# Patient Record
Sex: Female | Born: 1950 | Hispanic: Yes | Marital: Married | Smoking: Never smoker
Health system: Southern US, Community
[De-identification: ages and names within clinical notes are randomized; demographics above are authoritative.]

## PROBLEM LIST (undated history)

## (undated) DIAGNOSIS — E119 Type 2 diabetes mellitus without complications: Secondary | ICD-10-CM

## (undated) DIAGNOSIS — M1990 Unspecified osteoarthritis, unspecified site: Secondary | ICD-10-CM

## (undated) HISTORY — DX: Type 2 diabetes mellitus without complications: E11.9

---

## 2001-06-11 HISTORY — PX: COLONOSCOPY: SHX174

## 2003-06-12 HISTORY — PX: BREAST BIOPSY: SHX20

## 2010-06-11 DIAGNOSIS — E119 Type 2 diabetes mellitus without complications: Secondary | ICD-10-CM

## 2010-06-11 HISTORY — DX: Type 2 diabetes mellitus without complications: E11.9

## 2012-06-09 ENCOUNTER — Encounter: Payer: Self-pay | Admitting: Family Medicine

## 2012-06-09 ENCOUNTER — Ambulatory Visit (INDEPENDENT_AMBULATORY_CARE_PROVIDER_SITE_OTHER): Payer: Self-pay | Admitting: Family Medicine

## 2012-06-09 VITALS — BP 137/63 | HR 82 | Temp 98.8°F | Ht 59.5 in | Wt 201.0 lb

## 2012-06-09 DIAGNOSIS — M25562 Pain in left knee: Secondary | ICD-10-CM

## 2012-06-09 DIAGNOSIS — M25569 Pain in unspecified knee: Secondary | ICD-10-CM

## 2012-06-09 DIAGNOSIS — M25561 Pain in right knee: Secondary | ICD-10-CM

## 2012-06-09 DIAGNOSIS — Z0000 Encounter for general adult medical examination without abnormal findings: Secondary | ICD-10-CM

## 2012-06-09 DIAGNOSIS — R1032 Left lower quadrant pain: Secondary | ICD-10-CM

## 2012-06-09 MED ORDER — MELOXICAM 7.5 MG PO TABS: 7.5000 mg | ORAL_TABLET | Freq: Every day | ORAL | Status: DC

## 2012-06-09 NOTE — Progress Notes (Signed)
  Subjective:    Patient ID: Bridget Russo, female    DOB: [birth year], 61 y.o.   MRN: [id number]  HPI 61 yo F who recently moved here several months after moving from [place].  Was previously seen at [facility] and has now transferred her care to [place].    Little past medical history.  Main complaints for today are BL knee pain and Left-sided abdominal pain:  1.  Bilateral knee pain:  Present for years.  No inciting event or traumatic incidents.  Describes sharp stabbing pain with some burning bilaterally.  Has pain that is constant 3-4/10 in severity, at end of day worsens to 7/10.  Has been told she had arthritis in past.  No knee weakness, giving out, locking on her.   2.  Left sided abdominal pain:  Pain started 1 year but worse in past 2 months, described as constant.  Worse when she sleeps on Left side.  Has 2-3 bowel movements a day, describes attempting to evacuate bowels without any results occasionally.  Describes soft, light brown stools when she is able to go.  Occasionally has feeling of incomplete evacuation.    Preventative: - Pap smear October 2013 - never abnormal - Normal mammogram July 2012 - had breast biopsy in 2005 which was negative, otherwise no problems with mammogram - Colonoscopy:  2012 - told everything was okay - Flu shot:  Never had one in past, does not want one today.   Review of Systems The patient denies fever, unusual weight change, decreased hearing, chest pain, palpitations, pre-syncopal or syncopal episodes, dyspnea on exertion, prolonged cough, hemoptysis, change in bowel habits, melena, hematochezia, severe indigestion/heartburn, nausea/vomiting/abdominal pain, genital sores, muscle weakness, difficulty walking, abnormal bleeding, or enlarged lymph nodes.       Objective:   Physical Exam  BP 137/63  Pulse 82  Temp 98.8 F (37.1 C) (Oral)  Ht 4' 11.5" (1.511 m)  Wt 201 lb (91.173 kg)  BMI 39.92 kg/m2 Gen: Well NAD.  Comfortable appearing.    HEENT:  [place]/AT.  EOMI, PERRL.  MMM, tonsils non-erythematous, non-edematous.  External ears WNL, Bilateral TM's normal without retraction, redness or bulging.  Neck:  No thyromegaly Lungs: CTABL Nl WOB Heart: RRR no MRG Abd: Soft/ND.  Obese.  TTP LLQ only to deep palpation, mild in nature, no guarding or rebound.   Exts: Non edematous BL  LE, warm and well perfused.  MSK:    - Knees:  Bilaterally pain along joint line only to deep palpation.  No effusions noted.  No redness or warmth.  Full active/passive range of motion with crepitus noted BL.  Ant/post drawer tests negative with no ligamentous laxity BL.        Assessment & Plan:

## 2012-06-09 NOTE — Assessment & Plan Note (Addendum)
Interested in corticosteroid injection.  Mobic for now.  Tylenol as needed. FU after she receives [employer].   Check creatinine at that time before any refills for Meloxicam.

## 2012-06-09 NOTE — Patient Instructions (Signed)
Make sure to get the [facility].  Take the Meloxicam daily to help with the pain.  If it is very bad, take the extra-strength Tylenol if you need it.  Come back in 4 weeks once you have the card and we can do blood work and cortisone shot at that time.    It was good to meet you!

## 2012-06-09 NOTE — Assessment & Plan Note (Signed)
Patient up to date on everything except flu shot, which she declines today.

## 2012-06-09 NOTE — Assessment & Plan Note (Signed)
Possibly secondary to constipation.   Treat with stool softener and prune juice -- added these instructions in pen to her AVS after it was printed.   I provided the patient with explicit warnings and red flags that would prompt return to clinic or the ED.

## 2012-07-25 ENCOUNTER — Ambulatory Visit: Payer: Self-pay | Admitting: Family Medicine

## 2013-12-02 ENCOUNTER — Ambulatory Visit: Payer: No Typology Code available for payment source | Attending: Internal Medicine

## 2013-12-24 ENCOUNTER — Other Ambulatory Visit: Payer: Self-pay | Admitting: Obstetrics and Gynecology

## 2013-12-24 DIAGNOSIS — Z1231 Encounter for screening mammogram for malignant neoplasm of breast: Secondary | ICD-10-CM

## 2014-01-05 ENCOUNTER — Encounter (HOSPITAL_COMMUNITY): Payer: Self-pay

## 2014-01-05 ENCOUNTER — Ambulatory Visit (HOSPITAL_COMMUNITY)
Admission: RE | Admit: 2014-01-05 | Discharge: 2014-01-05 | Disposition: A | Discharge status: Home / Self Care | Payer: Self-pay | Source: Ambulatory Visit | Attending: Obstetrics and Gynecology | Admitting: Obstetrics and Gynecology

## 2014-01-05 VITALS — BP 120/78 | Temp 98.4°F | Ht 62.0 in | Wt 202.4 lb

## 2014-01-05 DIAGNOSIS — Z1231 Encounter for screening mammogram for malignant neoplasm of breast: Secondary | ICD-10-CM

## 2014-01-05 DIAGNOSIS — Z1239 Encounter for other screening for malignant neoplasm of breast: Secondary | ICD-10-CM

## 2014-01-05 HISTORY — DX: Unspecified osteoarthritis, unspecified site: M19.90

## 2014-01-05 NOTE — Progress Notes (Signed)
No complaints today.  Pap Smear:    Pap smear not completed today. Last Pap smear was in October 2013 at the free cervical cancer screening [facility] and normal per patient. Per patient has no history of an abnormal Pap smear. No Pap smear results in EPIC.  Physical exam: Breasts Breasts symmetrical. No skin abnormalities bilateral breasts. No nipple retraction bilateral breasts. No nipple discharge bilateral breasts. No lymphadenopathy. No lumps palpated bilateral breasts. No complaints of pain or tenderness on exam. Referred [facility] to [facility] for diagnostic mammogram per recommendation. Patients last mammogram was 12/18/2012 and needed 6 month follow-up. Patient has not followed-up. Appointment scheduled for Wednesday, January 06, 2014 at 1015.    Pelvic/Bimanual No Pap smear completed today since last Pap smear was October 2013 per patient. Pap smear not indicated per [facility]CP guidelines.

## 2014-01-05 NOTE — Patient Instructions (Addendum)
Explained to  Bridget Russo that she did not need a Pap smear today due to last Pap smear was in October 2013 per patient. Let her know [facility] will cover Pap smears every 3 years unless has a history of abnormal Pap smears. Referred [facility] to [facility] for diagnostic mammogram per recommendation. Appointment scheduled for Wednesday, January 06, 2014 at 1015. [facility] for Friday, January 08, 2014 at 0830. Encouraged patient to follow-up at either an Urgent Care or Emergency Department in regards to her dizziness and vertigo symptoms. Patient aware of appointments and will be there. Bridget Russo verbalized understanding.  Bridget Russo, Kathaleen Maserhristine Poll, RN 1:08 PM

## 2014-01-06 LAB — HM MAMMOGRAPHY

## 2014-01-08 ENCOUNTER — Ambulatory Visit (HOSPITAL_BASED_OUTPATIENT_CLINIC_OR_DEPARTMENT_OTHER): Payer: No Typology Code available for payment source

## 2014-01-08 ENCOUNTER — Encounter (HOSPITAL_COMMUNITY): Payer: Self-pay

## 2014-01-08 ENCOUNTER — Other Ambulatory Visit: Payer: No Typology Code available for payment source

## 2014-01-08 VITALS — BP 150/90 | HR 54 | Temp 98.0°F | Resp 22 | Ht 59.0 in | Wt 199.6 lb

## 2014-01-08 DIAGNOSIS — Z0000 Encounter for general adult medical examination without abnormal findings: Secondary | ICD-10-CM

## 2014-01-08 LAB — LIPID PANEL
CHOL/HDL RATIO: 2.5 ratio
Cholesterol: 241 mg/dL — ABNORMAL HIGH (ref 0–200)
HDL: 98 mg/dL (ref >39)
LDL CALC: 110 mg/dL — AB (ref 0–99)
TRIGLYCERIDES: 163 mg/dL — AB (ref <150)
VLDL: 33 mg/dL (ref 0–40)

## 2014-01-08 LAB — HEMOGLOBIN A1C
Hgb A1c MFr Bld: 6.7 % — ABNORMAL HIGH (ref <5.7)
MEAN PLASMA GLUCOSE: 146 mg/dL — AB (ref <117)

## 2014-01-08 LAB — GLUCOSE (CC13): Glucose: 110 mg/dl (ref 70–140)

## 2014-01-08 NOTE — Patient Instructions (Signed)
Discussed health assessment with patient. Informed patient that she would need to be followed up for blood pressure. She will be called with results of lab work and we will then discussed any further follow up the patient needs. Patient will implement behavior modifications to help lower BP. Patient verbalized understanding. 

## 2014-01-08 NOTE — Progress Notes (Signed)
[facility] and is currently a [facility] patient effective 01/04/2014 and with interpreter.   Clinical Measurements: Patient is 4 ft. 11 inches, weight 199.6 lbs, waist circumference 42.5 inches, and hip circumference 49 inches.   Medical History: Patient has no history of high cholesterol. Patient does not have a history of hypertension or diabetes. Patient states has Family history of diabetes. Per patient no diagnosed history of coronary heart disease, heart attack, heart failure, stroke/TIA, vascular disease or congenital heart defects.   Blood Pressure, Self-measurement: Patient states has no reason to check Blood pressure.  Nutrition Assessment: Patient stated that eats 3 fruits every day. Patient states she eats one to 2 servings of vegetables a day. Per patient eats 3 or more ounces of whole grains daily. Patient doesn't eat two or more servings of fish weekly. Patient states eats once a week. Patient states she does not drink more than 36 ounces or 450 calories of beverages with added sugars weekly. Patient stated does not watch salt intake.  Physical Activity Assessment: Patient states that does around 1140 minutes per week of moderate exercise a week by walking and house hold chores. Per patient does not do vigorous exercise.  Smoking Status: Patient states has never smoked. Patient is not exposed to smoke.  Quality of Life Assessment: In assessing patient's physical quality of life she stated that out of the past 30 days that she has felt her health was not good for 12 of them. Patient also stated that in the past 30 days that her mental health is not good including stress, depression and problems with emotions for 2 days. Patient did state that out of the past 30 days she felt her physical or mental health had not kept her from doing her usual activities including self-care, work or recreation.   Plan: Lab work will be done today including a lipid  panel, blood glucose, and Hgb A1C. Will call lab results when they are finished. Patient will return for Health Coaching and BP check. Will work on behavior modifications that we discussed to lower BP. Will get doctors appointment for Blood pressure.

## 2014-01-12 ENCOUNTER — Telehealth: Payer: Self-pay

## 2014-01-12 NOTE — Telephone Encounter (Signed)
Bridget RoyalsJulie Russo, interpreter tried to call patient on 01/11/14 and could not reach or leave message. The interpreter then texted the patient. I called patient and she stated could not read text and for [name] to call back because of patients limited English ability. Told patient that would get [name] to call tomorrow on 01/13/14.  PLAN: Call [name] on 01/13/14 to call patient with lab results and Doctor's appointment.

## 2014-01-18 ENCOUNTER — Ambulatory Visit: Payer: No Typology Code available for payment source | Attending: Internal Medicine | Admitting: Internal Medicine

## 2014-01-18 ENCOUNTER — Encounter: Payer: Self-pay | Admitting: Internal Medicine

## 2014-01-18 VITALS — BP 134/85 | HR 66 | Temp 97.9°F | Resp 14 | Ht 60.0 in | Wt 200.0 lb

## 2014-01-18 DIAGNOSIS — E785 Hyperlipidemia, unspecified: Secondary | ICD-10-CM | POA: Insufficient documentation

## 2014-01-18 DIAGNOSIS — E119 Type 2 diabetes mellitus without complications: Secondary | ICD-10-CM

## 2014-01-18 HISTORY — DX: Type 2 diabetes mellitus without complications: E11.9

## 2014-01-18 LAB — CBC WITH DIFFERENTIAL/PLATELET
Basophils Absolute: 0 10*3/uL (ref 0.0–0.1)
Basophils Relative: 0 % (ref 0–1)
EOS ABS: 0.1 10*3/uL (ref 0.0–0.7)
Eosinophils Relative: 1 % (ref 0–5)
HEMATOCRIT: 41.1 % (ref 36.0–46.0)
Hemoglobin: 13.9 g/dL (ref 12.0–15.0)
Lymphocytes Relative: 23 % (ref 12–46)
Lymphs Abs: 3 10*3/uL (ref 0.7–4.0)
MCH: 28.8 pg (ref 26.0–34.0)
MCHC: 33.8 g/dL (ref 30.0–36.0)
MCV: 85.3 fL (ref 78.0–100.0)
MONO ABS: 1.1 10*3/uL — AB (ref 0.1–1.0)
Monocytes Relative: 8 % (ref 3–12)
Neutro Abs: 9 10*3/uL — ABNORMAL HIGH (ref 1.7–7.7)
Neutrophils Relative %: 68 % (ref 43–77)
PLATELETS: 264 10*3/uL (ref 150–400)
RBC: 4.82 MIL/uL (ref 3.87–5.11)
RDW: 15.5 % (ref 11.5–15.5)
WBC: 13.2 10*3/uL — ABNORMAL HIGH (ref 4.0–10.5)

## 2014-01-18 LAB — POCT GLYCOSYLATED HEMOGLOBIN (HGB A1C): Hemoglobin A1C: 6.2

## 2014-01-18 MED ORDER — PRAVASTATIN SODIUM 20 MG PO TABS: 20.0000 mg | ORAL_TABLET | Freq: Every day | ORAL | Status: DC

## 2014-01-18 MED ORDER — METFORMIN HCL 500 MG PO TABS: 500.0000 mg | ORAL_TABLET | Freq: Two times a day (BID) | ORAL | Status: DC

## 2014-01-18 NOTE — Progress Notes (Signed)
Patient ID: Bridget Russo, female   DOB: April 07, 1951, 63 y.o.   MRN: [id number]   Bridget Russo, is a 63 y.o. female  NWG:[id number]  VHQ:[id number]  DOB - 03-Nov-1950  CC:  Chief Complaint  Patient presents with  . Establish Care       HPI: Bridget Russo is a 63 y.o. female here today to establish medical care. Patient recently went to [facility] program for a well visit including mammogram and Pap smear, there was need to do further testing because of her weight patient was found to have hemoglobin A1c of 6.7%, as well as high cholesterol level. She was subsequently sent to our clinic to establish care and for treatment of new onset diabetes. Patient has no personal history of diabetes or hypertension, she's not on any medication. She has had history of diabetes in her mother. Patient does not smoke cigarette, she does not drink alcohol. Patient has No headache, No chest pain, No abdominal pain - No Nausea, No new weakness tingling or numbness, No Cough - SOB.  No Known Allergies Past Medical History  Diagnosis Date  . Arthritis   . Type 2 diabetes mellitus without complication 01/18/2014   Current Outpatient Prescriptions on File Prior to Visit  Medication Sig Dispense Refill  . meloxicam (MOBIC) 7.5 MG tablet Take 1 tablet (7.5 mg total) by mouth daily.  30 tablet  0   No current facility-administered medications on file prior to visit.   Family History  Problem Relation Age of Onset  . Diabetes Mother   . Diabetes Sister   . Breast cancer Sister   . Diabetes Brother   . Breast cancer Maternal Grandmother    History   Social History  . Marital Status: Married    Spouse Name: N/A    Number of Children: N/A  . Years of Education: N/A   Occupational History  . Not on file.   Social History Main Topics  . Smoking status: Never Smoker   . Smokeless tobacco: Never Used  . Alcohol Use: No  . Drug Use: No  . Sexual Activity: No   Other Topics Concern  . Not on  file   Social History Narrative  . No narrative on file    Review of Systems: Constitutional: Negative for fever, chills, diaphoresis, activity change, appetite change and fatigue. HENT: Negative for ear pain, nosebleeds, congestion, facial swelling, rhinorrhea, neck pain, neck stiffness and ear discharge.  Eyes: Negative for pain, discharge, redness, itching and visual disturbance. Respiratory: Negative for cough, choking, chest tightness, shortness of breath, wheezing and stridor.  Cardiovascular: Negative for chest pain, palpitations and leg swelling. Gastrointestinal: Negative for abdominal distention. Genitourinary: Negative for dysuria, urgency, frequency, hematuria, flank pain, decreased urine volume, difficulty urinating and dyspareunia.  Musculoskeletal: Negative for back pain, joint swelling, arthralgia and gait problem. Neurological: Negative for dizziness, tremors, seizures, syncope, facial asymmetry, speech difficulty, weakness, light-headedness, numbness and headaches.  Hematological: Negative for adenopathy. Does not bruise/bleed easily. Psychiatric/Behavioral: Negative for hallucinations, behavioral problems, confusion, dysphoric mood, decreased concentration and agitation.    Objective:   Filed Vitals:   01/18/14 1539  BP: 134/85  Pulse: 66  Temp: 97.9 F (36.6 C)  Resp: 14    Physical Exam: Constitutional: Patient appears well-developed and well-nourished. No distress, Obese HENT: Normocephalic, atraumatic, External right and left ear normal. Oropharynx is clear and moist.  Eyes: Conjunctivae and EOM are normal. PERRLA, no scleral icterus. Neck: Normal ROM. Neck supple. No JVD. No tracheal  deviation. No thyromegaly. CVS: RRR, S1/S2 +, no murmurs, no gallops, no carotid bruit.  Pulmonary: Effort and breath sounds normal, no stridor, rhonchi, wheezes, rales.  Abdominal: Soft. BS +, no distension, tenderness, rebound or guarding.  Musculoskeletal: Normal range of  motion. No edema and no tenderness.  Lymphadenopathy: No lymphadenopathy noted, cervical, inguinal or axillary Neuro: Alert. Normal reflexes, muscle tone coordination. No cranial nerve deficit. Skin: Skin is warm and dry. No rash noted. Not diaphoretic. No erythema. No pallor. Psychiatric: Normal mood and affect. Behavior, judgment, thought content normal.  Lab Results  Component Value Date   HGBA1C 6.2 01/18/2014   HGBA1C 6.7* 01/08/2014    Lipid Panel     Component Value Date/Time   CHOL 241* 01/08/2014 0942   TRIG 163* 01/08/2014 0942   HDL 98 01/08/2014 0942   CHOLHDL 2.5 01/08/2014 0942   VLDL 33 01/08/2014 0942   LDLCALC 110* 01/08/2014 0942       Assessment and plan:   1. Type 2 diabetes mellitus without complication  - metFORMIN (GLUCOPHAGE) 500 MG tablet; Take 1 tablet (500 mg total) by mouth 2 (two) times daily with a meal.  Dispense: 180 tablet; Refill: 3  - Amb Referral to Nutrition and Diabetic Education - CBC with Differential - COMPLETE METABOLIC PANEL WITH GFR - POCT glycosylated hemoglobin (Hb A1C) - TSH - Urinalysis, Complete  2. Dyslipidemia  - pravastatin (PRAVACHOL) 20 MG tablet; Take 1 tablet (20 mg total) by mouth daily.  Dispense: 90 tablet; Refill: 3 - Lipid panel  Patient was extensively counseled about nutrition and exercise Interpreter was used to communicate directly with patient for the entire encounter including providing detailed patient instructions.  Return in about 3 months (around 04/20/2014), or if symptoms worsen or fail to improve, for Hemoglobin A1C and Follow up, DM, Follow up HTN.  The patient was given clear instructions to go to ER or return to medical center if symptoms don't improve, worsen or new problems develop. The patient verbalized understanding. The patient was told to call to get lab results if they haven't heard anything in the next week.     This note has been created with [employer] speech recognition software and smart  phrase technology. Any transcriptional errors are unintentional.    Jeanann LewandowskyJEGEDE, Candia Kingsbury, MD, MHA, FACP, FAAP [facility] [place], [place] [phone]   01/18/2014, 4:41 PM

## 2014-01-18 NOTE — Patient Instructions (Addendum)
Plan de alimentacin DASH (DASH Eating Plan) DASH es la sigla en ingls de "Enfoques Alimentarios para Detener la Hipertensin". El plan de alimentacin DASH ha demostrado bajar la presin arterial elevada (hipertensin). Los beneficios adicionales para la salud pueden incluir la disminucin del riesgo de diabetes mellitus tipo2, enfermedades cardacas e ictus. Este plan tambin puede ayudar a Horticulturist, commercial. QU DEBO SABER ACERCA DEL PLAN DE ALIMENTACIN DASH? Para el plan de alimentacin DASH, seguir las siguientes pautas generales:  Elija los alimentos con un valor porcentual diario de sodio de menos del 5% (segn figura en la etiqueta del alimento).  Use hierbas o aderezos sin sal, en lugar de sal de mesa o sal marina.  Consulte al mdico o farmacutico antes de usar sustitutos de la sal.  Coma productos con bajo contenido de sodio, cuya etiqueta suele decir "bajo contenido de sodio" o "sin agregado de sal".  Coma alimentos frescos.  Coma ms verduras, frutas y productos lcteos con bajo contenido de [place].  Elija los cereales integrales. Busque la palabra "integral" en Equities trader de la lista de ingredientes.  Elija el pescado y el pollo o el pavo sin piel ms a menudo que las carnes rojas. Limite el consumo de pescado, carne de ave y carne a 6onzas (170g) por Training and development officer.  Limite el consumo de dulces, postres, azcares y bebidas azucaradas.  Elija las grasas saludables para el corazn.  Limite el consumo de queso a 1onza (28g) por Training and development officer.  Consuma ms comida casera y menos de restaurante, de buf y comida rpida.  Limite el consumo de alimentos fritos.  Cocine los alimentos utilizando mtodos que no sean la fritura.  Limite las verduras enlatadas. Si las consume, enjuguelas bien para disminuir el sodio.  Cuando coma en un restaurante, pida que preparen su comida con menos sal o, en lo posible, sin nada de sal. QU ALIMENTOS PUEDO COMER? Pida ayuda a un nutricionista para  conocer las necesidades calricas individuales. Cereales Pan de salvado o integral. Arroz integral. Pastas de salvado o integrales. Quinua, trigo burgol y cereales integrales. Cereales con bajo contenido de sodio. Tortillas de harina de maz o de salvado. Pan de maz integral. Galletas saladas integrales. Galletas con bajo contenido de [place]. Vegetales Verduras frescas o congeladas (crudas, al vapor, asadas o grilladas). Jugos de tomate y verduras con contenido bajo o reducido de sodio. Pasta y salsa de tomate con contenido bajo o [place]. Verduras enlatadas con bajo contenido de sodio o reducido de sodio.  Lambert Mody Lambert Mody frescas, en conserva (en su jugo natural) o frutas congeladas. Carnes y otros productos con protenas Carne de res molida (al 85% o ms [place]), carne de res de animales alimentados con pastos o carne de res sin la grasa. Pollo o pavo sin piel. Carne de pollo o de [place]. Cerdo sin la grasa. Todos los pescados y frutos de mar. Huevos. Porotos, guisantes o lentejas secos. Frutos secos y semillas sin sal. Frijoles enlatados sin sal. Lcteos Productos lcteos con bajo contenido de grasas, como [place] o al 1%, quesos reducidos en grasas o al 2%, ricota con bajo contenido de grasas o [employer], o yogur natural con bajo contenido de [place]. Quesos con contenido bajo o reducido de sodio. Grasas y Naval architect en barra que no contengan grasas trans. Mayonesa y alios para ensaladas livianos o reducidos en grasas (reducidos en sodio). [place]. Aceites de crtamo, oliva o canola. Mantequilla natural de man o almendra. Otros Palomitas de maz y pretzels sin sal.  Los artculos mencionados arriba pueden no ser una lista completa de las bebidas o los alimentos recomendados. Comunquese con el nutricionista para conocer ms opciones. QU ALIMENTOS NO SE RECOMIENDAN? Cereales Pan blanco. Pastas blancas. Arroz blanco. Pan de maz refinado. Bagels y  croissants. Galletas saladas que contengan grasas trans. Vegetales Vegetales con crema o fritos. Verduras en salsa de queso. Verduras enlatadas comunes. Pasta y salsa de tomate en lata comunes. Jugos comunes de tomate y de verduras. Frutas Frutas secas. Fruta enlatada en almbar liviano o espeso. Jugo de frutas. Carnes y otros productos con protenas [place] de carne con grasa. Costillas, alas de pollo, tocineta, salchicha, mortadela, salame, chinchulines, tocino, perros calientes, salchichas alemanas y embutidos envasados. Frutos secos y semillas con sal. Frijoles con sal en lata. Lcteos Leche entera o al 2%, crema, mezcla de leche y crema, y queso crema. Yogur entero o endulzado. Quesos o queso azul con alto contenido de grasas. Cremas no lcteas y coberturas batidas. Quesos procesados, quesos para untar o cuajadas. Condimentos Sal de cebolla y ajo, sal condimentada, sal de mesa y sal marina. Salsas en lata y envasadas. Salsa [place]. Salsa trtara. Salsa barbacoa. Salsa teriyaki. Salsa de soja, incluso la que tiene contenido reducido de sodio. Salsa de carne. Salsa de pescado. Salsa de ostras. Salsa rosada. Rbano picante. Ketchup y mostaza. Saborizantes y tiernizantes para carne. Caldo en cubitos. Salsa picante. Salsa tabasco. Adobos. Aderezos para tacos. Salsas. Grasas y aceites Mantequilla, margarina en barra, manteca de cerdo, grasa, mantequilla clarificada y grasa de tocino. Aceites de coco, de palmiste o de palma. Aderezos comunes para ensalada. Otros Pickles y aceitunas. Palomitas de maz y pretzels con sal. Los artculos mencionados arriba pueden no ser una lista completa de las bebidas y los alimentos que se deben evitar. Comunquese con el nutricionista para obtener ms informacin. DNDE PUEDO ENCONTRAR MS INFORMACIN? Instituto Nacional del Corazn, del Pulmn y de la Sangre (National Heart, Lung, and Blood Institute):  www.nhlbi.nih.gov/health/health-topics/topics/dash/ Document Released: 05/17/2011 Document Revised: 10/12/2013 [name] 2015 ExitCare, LLC. This information is not intended to replace advice given to you by your health care provider. Make sure you discuss any questions you have with your health care provider. La diabetes mellitus y los alimentos (Diabetes Mellitus and Food) Es importante que controle su nivel de azcar en la sangre (glucosa). El nivel de glucosa en sangre depende en gran medida de lo que usted come. Comer alimentos saludables en las cantidades adecuadas a lo largo del da, aproximadamente a la misma hora todos los das, lo ayudar a controlar su nivel de glucosa en sangre. Tambin puede ayudarlo a retrasar o evitar el empeoramiento de la diabetes mellitus. Comer de manera saludable incluso puede ayudarlo a mejorar el nivel de presin arterial y a alcanzar o mantener un peso saludable.  CMO PUEDEN AFECTARME LOS ALIMENTOS? Carbohidratos Los carbohidratos afectan el nivel de glucosa en sangre ms que cualquier otro tipo de alimento. El nutricionista lo ayudar a determinar cuntos carbohidratos puede consumir en cada comida y ensearle a contarlos. El recuento de carbohidratos es importante para mantener la glucosa en sangre en un nivel saludable, en especial si utiliza insulina o toma determinados medicamentos para la diabetes mellitus. Alcohol El alcohol puede provocar disminuciones sbitas de la glucosa en sangre (hipoglucemia), en especial si utiliza insulina o toma determinados medicamentos para la diabetes mellitus. La hipoglucemia es una afeccin que puede poner en peligro la vida. Los sntomas de la hipoglucemia (somnolencia, mareos y desorientacin) son similares a los sntomas de   haber consumido mucho alcohol.  Si el mdico lo autoriza a beber alcohol, hgalo con moderacin y siga estas pautas:  Las mujeres no deben beber ms de un trago por da, y los  hombres no deben beber ms de dos tragos por Training and development officer. Un trago es igual a:  12 onzas (355 ml) de cerveza  5 onzas de vino (150 ml) de vino  1,5onzas (39m) de bebidas espirituosas  No beba con el estmago vaco.  Mantngase hidratado. Beba agua, gaseosas dietticas o t helado sin azcar.  Las gaseosas comunes, los jugos y otros refrescos podran contener muchos carbohidratos y se dCivil Service fast streamer QU ALIMENTOS NO SE RECOMIENDAN? Cuando haga las elecciones de alimentos, es importante que recuerde que todos los alimentos son distintos. Algunos tienen menos nutrientes que otros por porcin, aunque podran tener la misma cantidad de caloras o carbohidratos. Es difcil darle al cuerpo lo que necesita cuando consume alimentos con menos nutrientes. Estos son algunos ejemplos de alimentos que debera evitar ya que contienen muchas caloras y carbohidratos, pero pocos nutrientes:  GPhysicist, medicaltrans (la mayora de los alimentos procesados incluyen grasas trans en la etiqueta de Informacin nutricional).  Gaseosas comunes.  Jugos.  Caramelos.  Dulces, como tortas, pasteles, rosquillas y gHastings  Comidas fritas. QU ALIMENTOS PUEDO COMER? Consuma alimentos ricos en nutrientes, que nutrirn el cuerpo y lo mantendrn saludable. Los alimentos que debe comer tambin dependern de varios factores, como:  Las caloras que necesita.  Los medicamentos que toma.  Su peso.  El nivel de glucosa en sFairmont City  El nElysiande presin arterial.  El nivel de colesterol. Tambin debe consumir una variedad de aF[place] como:  Protenas, como carne, aves, pescado, tofu, frutos secos y semillas (las protenas de aWestportmagros son mejores).  FLambert Mody  Verduras.  Productos lcteos, como lVerdi queso y yogur (descremados son mejores).  Panes, granos, pastas, cereales, arroz y frijoles.  Grasas, como aceite de oDukedom mCentral African Republicsin grasas trans, aceite de canola, aguacate y aElliott TODOS LOS QUE  PADECEN DIABETES MELLITUS TIENEN EL MDanvillePLAN DE CHydaburg Dado que todas las personas que padecen diabetes mellitus son distintas, no hay un solo plan de comidas que funcione para todos. Es muy importante que se rena con un nutricionista que lo ayudar a crear un plan de comidas adecuado para usted. Document Released: 09/04/2007 Document Revised: 06/02/2013 EPac[facility] Surgery CenterPatient Information 2015 EAguilar This information is not intended to replace advice given to you by your health care provider. Make sure you discuss any questions you have with your health care provider. Diabetes y aKandace Blitzfsica (Diabetes and Exercise) Hacer actividad fsica con regularidad es muy importante. No se trata solo de pThe Mutual of Omaha Tiene muchos otros beneficios, como por ejemplo:  Mejorar el estado fsico, la flexibilidad y la resistencia.  Aumenta la densidad sea.  Ayuda a cTechnical sales engineer  Disminuye la gAir traffic controller  Aumenta la fuerza muscular.  Reduce el estrs y las tensiones.  Mejora el estado de salud general. Las personas diabticas que realizan actividad fsica tienen beneficios adicionales debido al ejercicio:  Reduce el apetito.  El organismo mejora el uso del azcar (glucosa) de la s[place]  Ayuda a disminuir o cProduct/process development scientist  Disminuye la presin arterial.  Ayuda a disminuir los lpidos en la sangre (colesterol y triglicridos).  El organismo mejora el uso de la insulina porque:  Aumenta la sensibilidad del organismo a la insulina.  Reduce las necesidades de insulina del organismo.  DApache Junctionriesgo de  enfermedad cardaca por la actividad fsica ya que  disminuye el colesterol y TEPP[employer] Partners triglicridos.  Aumenta los niveles de colesterol bueno (como las lipoprotenas de alta densidad [HDL]) en el organismo.  Disminuye los niveles de glucosa en la [place]. SU PLAN DE ACTIVIDAD  Elija una actividad que disfrute y establezca objetivos realistas. Su  mdico o educador en diabetes podrn ayudarlo a encontrar una actividad que lo beneficie. Haga ejercicio regularmente como se lo haya indicado el mdico. Esto incluye:  Hacer entrenamiento de [employer] Gr[employer]man a la semana, como flexiones, sentadillas, levantar peso o usar bandas de resistencia.  Practicar de ejercicios cardiovasculares cada semana, como caminar, correr o hacer algn deporte.  Mantenerse activo y no permanecer inactivo durante ms de seguidos. Los perodos cortos de [place] tambin son beneficiosos. Tres sesiones de a lo largo del da son tan beneficiosas como una sola sesin de . Estas son algunas ideas para los ejercicios:  Lleve a Multimedia programmer.  Utilice las Microbiologist del ascensor.  Baile su cancin favorita.  Haga los ejercicios de un video de ejercicios.  Haga sus ejercicios favoritos con Leisure centre manager. RECOMENDACIONES PARA REALIZAR EJERCICIOS CUANDO SE TIENE DIABETES TIPO 1 O TIPO 2   Controle la glucosa en la sangre antes de comenzar. Si el nivel de glucosa en la sangre es de ms de 240 mg/dl, controle las cetonas en la [place]. No haga actividad fsica si hay cetonas.  Evite inyectarse insulina en las zonas del cuerpo que ejercitar. Por ejemplo, evite inyectarse insulina en:  Los brazos, si juega al tenis.  Las piernas, si corre.  Lleve un registro de:  Los alimentos que consume antes y despus de Tour manager.  Los momentos esperables de picos de accin de la insulina.  Los niveles de glucosa en la sangre antes y despus de hacer ejercicios.  El tipo y cantidad de [place] fsica que Biomedical engineer.  Revise los registros con su mdico. El mdico lo ayudar a Environmental education officer pautas para ajustar la cantidad de alimento y las cantidades de insulina antes y despus de Radio producer ejercicios.  Si toma insulina o agentes hipoglucemiantes por va oral, observe si hay signos y sntomas de hipoglucemia. Entre los que  se incluyen:  Mareos.  Temblores.  Sudoracin.  Escalofros.  Confusin.  Beba gran cantidad de agua mientras hace ejercicios para evitar la deshidratacin o los golpes de Airline pilot. Durante la actividad fsica se pierde agua corporal que se debe reponer.  Comente con su mdico antes de comenzar un programa de actividad fsica para verificar que sea seguro para usted. Recuerde, cualquier actividad es mejor que ninguna. Document Released: 06/17/2007 Document Revised: 10/12/2013 [facility] [place] Patient Information 2015 [place], [place]. This information is not intended to replace advice given to you by your health care provider. Make sure you discuss any questions you have with your health care provider. Food Choices to Lower Your Triglycerides  Triglycerides are a type of fat in your blood. High levels of triglycerides can increase the risk of heart disease and stroke. If your triglyceride levels are high, the foods you eat and your eating habits are very important. Choosing the right foods can help lower your triglycerides.  WHAT GENERAL GUIDELINES DO I NEED TO FOLLOW?  Lose weight if you are overweight.   Limit or avoid alcohol.   Fill one half of your plate with vegetables and green salads.   Limit fruit to two servings a day. Choose fruit instead of juice.  Make one fourth of your plate whole grains. Look for the word "whole" as the first word in the ingredient list.  Fill one fourth of your plate with lean protein foods.  Enjoy fatty fish (such as salmon, mackerel, sardines, and tuna) three times a week.   Choose healthy fats.   Limit foods high in starch and sugar.  Eat more home-cooked food and less restaurant, buffet, and fast food.  Limit fried foods.  Cook foods using methods other than frying.  Limit saturated fats.  Check ingredient lists to avoid foods with partially hydrogenated oils (trans fats) in them. WHAT FOODS CAN I EAT?  Grains Whole grains, such as  whole wheat or whole grain breads, crackers, cereals, and pasta. Unsweetened oatmeal, bulgur, barley, quinoa, or brown rice. Corn or whole wheat flour tortillas.  Vegetables Fresh or frozen vegetables (raw, steamed, roasted, or grilled). Green salads. Fruits All fresh, canned (in natural juice), or frozen fruits. Meat and Other Protein Products Ground beef (85% or leaner), grass-fed beef, or beef trimmed of fat. Skinless chicken or [place]. Ground chicken or [place]. Pork trimmed of fat. All fish and seafood. Eggs. Dried beans, peas, or lentils. Unsalted nuts or seeds. Unsalted canned or dry beans. Dairy Low-fat dairy products, such as skim or 1% milk, 2% or reduced-fat cheeses, low-fat ricotta or cottage cheese, or plain low-fat yogurt. Fats and Oils Tub margarines without trans fats. Light or reduced-fat mayonnaise and salad dressings. Avocado. Safflower, olive, or canola oils. Natural peanut or almond butter. The items listed above may not be a complete list of recommended foods or beverages. Contact your dietitian for more options. WHAT FOODS ARE NOT RECOMMENDED?  Grains White bread. White pasta. White rice. Cornbread. Bagels, pastries, and croissants. Crackers that contain trans fat. Vegetables White potatoes. Corn. Creamed or fried vegetables. Vegetables in a cheese sauce. Fruits Dried fruits. Canned fruit in light or heavy syrup. Fruit juice. Meat and Other Protein Products Fatty cuts of meat. Ribs, chicken wings, bacon, sausage, bologna, salami, chitterlings, fatback, hot dogs, bratwurst, and packaged luncheon meats. Dairy Whole or 2% milk, cream, half-and-half, and cream cheese. Whole-fat or sweetened yogurt. Full-fat cheeses. Nondairy creamers and whipped toppings. Processed cheese, cheese spreads, or cheese curds. Sweets and Desserts Corn syrup, sugars, honey, and molasses. Candy. Jam and jelly. Syrup. Sweetened cereals. Cookies, pies, cakes, donuts, muffins, and ice cream. Fats and  Oils Butter, stick margarine, lard, shortening, ghee, or bacon fat. Coconut, palm kernel, or palm oils. Beverages Alcohol. Sweetened drinks (such as sodas, lemonade, and fruit drinks or punches). The items listed above may not be a complete list of foods and beverages to avoid. Contact your dietitian for more information. Document Released: 03/15/2004 Document Revised: 06/02/2013 Document Reviewed: 04/01/2013 [facility] LLCExit[facility] Patient Information 2015 [place] CityExitCare, [name]. This information is not intended to replace advice given to you by your health care provider. Make sure you discuss any questions you have with your health care provider.

## 2014-01-18 NOTE — Progress Notes (Signed)
Pt is here to establish care. Pt has recently found out that she has diabetes. Pt also reports having chronic b/l knee pain.

## 2014-01-19 ENCOUNTER — Telehealth: Payer: Self-pay | Admitting: Emergency Medicine

## 2014-01-19 LAB — COMPLETE METABOLIC PANEL WITH GFR
ALT: 27 U/L (ref 0–35)
AST: 22 U/L (ref 0–37)
Albumin: 3.9 g/dL (ref 3.5–5.2)
Alkaline Phosphatase: 44 U/L (ref 39–117)
BILIRUBIN TOTAL: 0.3 mg/dL (ref 0.2–1.2)
BUN: 17 mg/dL (ref 6–23)
CO2: 26 meq/L (ref 19–32)
CREATININE: 0.57 mg/dL (ref 0.50–1.10)
Calcium: 9.5 mg/dL (ref 8.4–10.5)
Chloride: 102 mEq/L (ref 96–112)
GFR, Est Non African American: >89 mL/min
Glucose, Bld: 108 mg/dL — ABNORMAL HIGH (ref 70–99)
Potassium: 4.3 mEq/L (ref 3.5–5.3)
Sodium: 137 mEq/L (ref 135–145)
Total Protein: 6.5 g/dL (ref 6.0–8.3)

## 2014-01-19 LAB — URINALYSIS, COMPLETE
Bacteria, UA: NONE SEEN
Bilirubin Urine: NEGATIVE
Casts: NONE SEEN
GLUCOSE, UA: NEGATIVE mg/dL
Hgb urine dipstick: NEGATIVE
Ketones, ur: NEGATIVE mg/dL
LEUKOCYTES UA: NEGATIVE
Nitrite: NEGATIVE
Protein, ur: NEGATIVE mg/dL
SQUAMOUS EPITHELIAL / LPF: NONE SEEN
Specific Gravity, Urine: 1.025 (ref 1.005–1.030)
UROBILINOGEN UA: 0.2 mg/dL (ref 0.0–1.0)
pH: 6 (ref 5.0–8.0)

## 2014-01-19 LAB — LIPID PANEL
Cholesterol: 238 mg/dL — ABNORMAL HIGH (ref 0–200)
HDL: 95 mg/dL (ref >39)
LDL CALC: 90 mg/dL (ref 0–99)
TRIGLYCERIDES: 266 mg/dL — AB (ref <150)
Total CHOL/HDL Ratio: 2.5 Ratio
VLDL: 53 mg/dL — ABNORMAL HIGH (ref 0–40)

## 2014-01-19 LAB — TSH: TSH: 0.67 u[IU]/mL (ref 0.350–4.500)

## 2014-01-19 NOTE — Telephone Encounter (Signed)
Message copied by Darlis LoanSMITH, Xayla Puzio D on Tue Jan 19, 2014  4:30 PM ------      Message from: Quentin AngstJEGEDE, OLUGBEMIGA E      Created: Tue Jan 19, 2014  9:03 AM       Please inform patient that her laboratory tests results are mostly within normal except for her cholesterol level. Advise her to continue her medications as prescribed, do regular physical exercise and low carbohydrate, low cholesterol and low fat diet ------

## 2014-01-22 ENCOUNTER — Telehealth: Payer: Self-pay | Admitting: Emergency Medicine

## 2014-01-22 NOTE — Telephone Encounter (Signed)
Pt given lab results with instructions on dieting and exercising control to prevent CAD

## 2014-02-08 ENCOUNTER — Telehealth: Payer: Self-pay

## 2014-02-08 NOTE — Telephone Encounter (Signed)
Called patient and patient stated that now has [facility] from [facility]. [facility] referred her to Diabetes management and [facility] will cover the Community visit.

## 2014-03-02 ENCOUNTER — Ambulatory Visit: Payer: No Typology Code available for payment source | Admitting: Dietician

## 2014-04-12 ENCOUNTER — Encounter: Payer: Self-pay | Admitting: Internal Medicine

## 2014-05-04 ENCOUNTER — Encounter: Payer: Self-pay | Admitting: Internal Medicine

## 2014-05-04 ENCOUNTER — Ambulatory Visit: Payer: Self-pay | Attending: Internal Medicine | Admitting: Internal Medicine

## 2014-05-04 VITALS — BP 128/85 | HR 81 | Temp 98.8°F | Resp 16 | Ht 60.0 in | Wt 197.0 lb

## 2014-05-04 DIAGNOSIS — M170 Bilateral primary osteoarthritis of knee: Secondary | ICD-10-CM

## 2014-05-04 DIAGNOSIS — E119 Type 2 diabetes mellitus without complications: Secondary | ICD-10-CM

## 2014-05-04 DIAGNOSIS — E785 Hyperlipidemia, unspecified: Secondary | ICD-10-CM

## 2014-05-04 LAB — POCT GLYCOSYLATED HEMOGLOBIN (HGB A1C): HEMOGLOBIN A1C: 6.3

## 2014-05-04 LAB — GLUCOSE, POCT (MANUAL RESULT ENTRY): POC Glucose: 122 mg/dl — AB (ref 70–99)

## 2014-05-04 MED ORDER — METFORMIN HCL 500 MG PO TABS: 500.0000 mg | ORAL_TABLET | Freq: Two times a day (BID) | ORAL | Status: DC

## 2014-05-04 MED ORDER — PRAVASTATIN SODIUM 20 MG PO TABS: 20.0000 mg | ORAL_TABLET | Freq: Every day | ORAL | Status: DC

## 2014-05-04 MED ORDER — ACETAMINOPHEN-CODEINE #3 300-30 MG PO TABS: 1.0000 | ORAL_TABLET | ORAL | Status: DC | PRN

## 2014-05-04 MED ORDER — MELOXICAM 7.5 MG PO TABS: 7.5000 mg | ORAL_TABLET | Freq: Every day | ORAL | Status: DC

## 2014-05-04 NOTE — Progress Notes (Signed)
Patient ID: Bridget GladeDelsy Russo, female   DOB: [birth year], 63 y.o.   MRN: [id number]   Bridget Russo, is a 63 y.o. female  [id number]  [id number]  DOB - [birth year]  Chief Complaint  Patient presents with  . Follow-up        Subjective:   Bridget Russo is a 63 y.o. female here today for a follow up visit. Patient with history of type 2 diabetes mellitus and primary osteoarthritis of both knees here today for routine follow-up. Patient is asking if she needs to continue her diabetic medications. She needs refills on all her medications. She has no complaint today. Patient has No headache, No chest pain, No abdominal pain - No Nausea, No new weakness tingling or numbness, No Cough - SOB.  Problem  Primary Osteoarthritis of Both Knees    ALLERGIES: No Known Allergies  PAST MEDICAL HISTORY: Past Medical History  Diagnosis Date  . Arthritis   . Type 2 diabetes mellitus without complication 01/18/2014    MEDICATIONS AT HOME: Prior to Admission medications   Medication Sig Start Date End Date Taking? Authorizing Provider  meloxicam (MOBIC) 7.5 MG tablet Take 1 tablet (7.5 mg total) by mouth daily. 05/04/14  Yes Quentin Angstlugbemiga E Reece Fehnel, MD  metFORMIN (GLUCOPHAGE) 500 MG tablet Take 1 tablet (500 mg total) by mouth 2 (two) times daily with a meal. 05/04/14  Yes Alis Sawchuk E Hyman HopesJegede, MD  pravastatin (PRAVACHOL) 20 MG tablet Take 1 tablet (20 mg total) by mouth daily. 05/04/14  Yes Quentin Angstlugbemiga E Latoyna Hird, MD  acetaminophen-codeine (TYLENOL #3) 300-30 MG per tablet Take 1 tablet by mouth every 4 (four) hours as needed. 05/04/14   Quentin Angstlugbemiga E Clenton Esper, MD     Objective:   Filed Vitals:   05/04/14 1019  BP: 128/85  Pulse: 81  Temp: 98.8 F (37.1 C)  TempSrc: Oral  Resp: 16  Height: 5' (1.524 m)  Weight: 197 lb (89.359 kg)  SpO2: 96%    Exam General appearance : Awake, alert, not in any distress. Speech Clear. Not toxic looking HEENT: Atraumatic and Normocephalic, pupils equally reactive  to light and accomodation Neck: supple, no JVD. No cervical lymphadenopathy.  Chest:Good air entry bilaterally, no added sounds  CVS: S1 S2 regular, no murmurs.  Abdomen: Bowel sounds present, Non tender and not distended with no gaurding, rigidity or rebound. Extremities: B/L Lower Ext shows no edema, both legs are warm to touch Neurology: Awake alert, and oriented X 3, CN II-XII intact, Non focal Skin:No Rash Wounds:N/A  Data Review Lab Results  Component Value Date   HGBA1C 6.3 05/04/2014   HGBA1C 6.2 01/18/2014   HGBA1C 6.7* 01/08/2014     Assessment & Plan   1. Type 2 diabetes mellitus without complication  - Glucose (CBG) - HgB A1c is 6.3% today Continue - metFORMIN (GLUCOPHAGE) 500 MG tablet; Take 1 tablet (500 mg total) by mouth 2 (two) times daily with a meal.  Dispense: 180 tablet; Refill: 3   Aim for 2-3 Carb Choices per meal (30-45 grams) +/- 1 either way  Aim for 0-15 Carbs per snack if hungry  Include protein in moderation with your meals and snacks  Consider reading food labels for Total Carbohydrate and Fat Grams of foods  Consider checking BG at alternate times per day  Continue taking medication as directed Fruit Punch - find one with no sugar  Measure and decrease portions of carbohydrate foods  Make your plate and don't go back for seconds   2. Dyslipidemia  -  pravastatin (PRAVACHOL) 20 MG tablet; Take 1 tablet (20 mg total) by mouth daily.  Dispense: 90 tablet; Refill: 3  To address this please limit saturated fat to no more than 7% of your calories, limit cholesterol to 200 mg/day, increase fiber and exercise as tolerated. If needed we may add another cholesterol lowering medication to your regimen.   3. Primary osteoarthritis of both knees  - meloxicam (MOBIC) 7.5 MG tablet; Take 1 tablet (7.5 mg total) by mouth daily.  Dispense: 60 tablet; Refill: 3 - acetaminophen-codeine (TYLENOL #3) 300-30 MG per tablet; Take 1 tablet by mouth every 4 (four)  hours as needed.  Dispense: 60 tablet; Refill: 0  Interpreter was used to communicate directly with patient for the entire encounter including providing detailed patient instructions.  Return in about 3 months (around 08/04/2014) for Hemoglobin A1C and Follow up, DM, Follow up Pain and comorbidities.  The patient was given clear instructions to go to ER or return to medical center if symptoms don't improve, worsen or new problems develop. The patient verbalized understanding. The patient was told to call to get lab results if they haven't heard anything in the next week.   This note has been created with Education officer, environmentalDragon speech recognition software and smart phrase technology. Any transcriptional errors are unintentional.    Bridget Russo, Bridget Bordonaro, MD, MHA, FACP, FAAP [facility] [place], [place] [phone]   05/04/2014, 11:16 AM

## 2014-05-04 NOTE — Progress Notes (Signed)
Pt is here following up on her hyperlipidemia. Pt is also here to check to see if she needs to be on her diabetes medications. Pt is out of all her medications and needs refills.

## 2014-05-04 NOTE — Patient Instructions (Signed)
Recuento bsico de carbohidratos para la diabetes mellitus (Basic Carbohydrate Counting for Diabetes Mellitus) El recuento de carbohidratos es un mtodo destinado a calcular la cantidad de carbohidratos en la dieta. El consumo de carbohidratos aumenta naturalmente el nivel de azcar (glucosa) en la sangre, por lo que es importante que sepa la cantidad que debe incluir en cada comida. El recuento de carbohidratos ayuda a mantener el nivel de glucosa en la sangre dentro de los lmites normales. La cantidad permitida de carbohidratos es diferente para cada persona. Un nutricionista puede ayudarlo a calcular la cantidad adecuada para usted. Una vez que sepa la cantidad de carbohidratos que puede consumir, podr calcular los carbohidratos de los alimentos que desea comer. Los siguientes alimentos incluyen carbohidratos:  Granos, como panes y cereales.  Frijoles secos y productos con soja.  Vegetales almidonados, como papas, guisantes y maz.  Frutas y jugos de frutas.  Leche y yogur.  Dulces y bocadillos, como pastel, galletas, caramelos, papas fritas de bolsa, refrescos y bebidas frutales con azcar. RECUENTO DE CARBOHIDRATOS Hay dos maneras de calcular los carbohidratos de los alimentos. Puede usar cualquiera de los dos mtodos o una combinacin de ambos. Leer la etiqueta de informacin nutricional de los alimentos envasados La informacin nutricional es una etiqueta incluida en casi todas las bebidas y los alimentos envasados de los [place]. Indica el tamao de la porcin de ese alimento o bebida e informacin sobre los nutrientes de cada porcin, incluso los gramos (g) de carbohidratos por porcin.  Decida la cantidad de porciones que comer o tomar de este alimento o bebida. Multiplique la cantidad de porciones por el nmero de gramos de carbohidratos indicados en la etiqueta para esa porcin. El total ser la cantidad de carbohidratos que consumir al comer ese alimento o tomar esa  bebida. Conocer las porciones estndar de los alimentos Cuando coma alimentos no envasados o que no incluyan la informacin nutricional en la etiqueta, deber medir las porciones para poder calcular la cantidad de carbohidratos. Una porcin de la mayora de los alimentos ricos en carbohidratos contiene alrededor de 15g de carbohidratos. La siguiente lista incluye los tamaos de porcin de los alimentos ricos en carbohidratos que contienen alrededor de 15g de carbohidratos por porcin:   1rebanada de pan (1oz) o 1tortilla de seis pulgadas.  panecillo de hamburguesa o bollito tipo ingls.  4a 6galletas.   de taza de cereal sin azcar y seco.   taza de cereal caliente.   de taza de arroz o pastas.  taza de pur de papas o de una papa grande al horno.  1taza de frutas frescas o una fruta pequea.  taza de frutas o jugo de frutas enlatados o congelados.  1 taza de leche.   de taza de yogur descremado sin ningn agregado o de yogur endulzado con edulcorante artificial.  taza de vegetales almidonados, como guisantes, maz o papas, o de frijoles secos cocidos. Decida la cantidad de porciones estndar que comer. Multiplique la cantidad de porciones por 15 (los gramos de carbohidratos en esa porcin). Por ejemplo, si come 2tazas de fresas, habr comido 2porciones y 30g de carbohidratos (2porciones x 15g = 30g). Para las comidas como sopas y guisos, en las que se mezcla ms de un alimento, deber contar los carbohidratos de cada alimento incluido. EJEMPLO DE RECUENTO DE CARBOHIDRATOS Ejemplo de cena  3 onzas de pechugas de pollo.   de taza de arroz integral.   taza de maz.  1 taza de leche.  1 taza de fresas   con crema batida sin azcar. Clculo de carbohidratos [place] 1: Identifique los alimentos que contienen carbohidratos:   Arroz.  Maz.  Leche.  Bridget SandersFresas. [place] 2: Calcule el nmero de porciones que consumir de cada uno:   2 porciones de  Bridget Russo, mineralsarroz.  1 porcin de maz.  1 porcin de leche.  1 porcin de fresas. [place] 3: Multiplique cada una de esas porciones por 15g:   2 porciones de arroz x 15 g = 30 g.  1 porcin de maz x 15 g = 15 g.  1 porcin de leche x 15 g = 15 g.  1 porcin de fresas x 15 g = 15 g. Paso 4: Sume todas las cantidades para Artistconocer el total de gramos de carbohidratos consumidos: 30 g + 15 g + 15 g + 15 g = 75 g. Document Released: 08/20/2011 Document Revised: 10/12/2013 [facility] LLCExitCare Patient Russo 2015 [place]ExitCare, Bridget Russo. This Russo is not intended to replace advice given to you by your health care provider. Make sure you discuss any questions you have with your health care provider. Osteoartritis (Osteoarthritis) La osteoartritis es una enfermedad que provoca dolor e inflamacin en las articulaciones. Ocurre cuando el cartlago de la articulacin afectada se desgasta. El cartlago acta como una almohadilla que cubre los extremos de los huesos que forman una articulacin. La osteoartritis es la ms frecuente de reumatismo articular. Afecta a menudo a los ancianos. Las articulaciones que se ven ms afectadas por esta afeccin son las que se encuentran en las siguientes zonas:  Los extremos de los dedos.  Los pulgares.  El cuello.  La parte inferior de la espalda.  Las rodillas.  Las caderas CAUSAS  Con el paso del [name], el cartlago que recubre los extremos de los huesos comienza a Acupuncturistdesgastarse. Esto provoca friccin Bridget Corporationentre los huesos, lo que causa dolor y entumecimiento en las articulaciones afectadas.  FACTORES DE RIESGO Ciertos factores pueden aumentar las probabilidades de padecer osteoartritis, incluidos los siguientes:  Edad avanzada.  Exceso de Bridget Russo.  Uso excesivo de la articulacin. SIGNOS Y SNTOMAS   Dolor, hinchazn y entumecimiento en la articulacin.  Con el tiempo, la articulacin pierde su forma normal.  Pueden formarse pequeos depsitos de hueso  (ostefitos) en los extremos de Nurse, learning disabilityla articulacin.  Algunos trozos de Bridget Chemicalhueso o cartlago pueden separarse y flotar dentro del espacio de la articulacin. Esto puede causar ms dolor y lesiones. DIAGNSTICO  El mdico le preguntar acerca de sus sntomas y le har un examen fsico. Le indicarn varios estudios, como:  Radiografas de Statisticianla articulacin afectada.  Una resonancia magntica (RM).  Anlisis de sangre para descartar otros tipos de artritis.  Anlisis de los fluidos de Nurse, learning disabilityla articulacin. Para ello se utiliza una aguja para extraer lquido de la articulacin y examinarlo en el microscopio. TRATAMIENTO  Los PepsiCoobjetivos del tratamiento son Human resources officercontrolar el dolor y mejorar el funcionamiento de Nurse, learning disabilityla articulacin. Los planes de tratamiento pueden incluir lo siguiente:  Un programa de ejercicios recomendado que permita el descanso y el alivio de la articulacin.  Un plan de control del peso.  Tcnicas de Occidental Petroleumalivio del dolor, como las siguientes:  Aplicacin correcta de fro y Airline pilotcalor.  Impulsos elctricos enviados a las terminaciones nerviosas que se encuentran debajo de la piel (neuroestimulacin elctrica transcutnea [TENS, por sus siglas en ingls]).  Masajes.  Ciertos suplementos nutricionales.  Medicamentos para Human resources officercontrolar el dolor como:  Paracetamol.  Antiinflamatorios no esteroides (AINE), como el naproxeno.  Narcticos o agentes de accin central, como el tramadol.  Corticoides. Estos se pueden administrar por va oral o mediante una inyeccin.  Ciruga para reposicionar los TransMontaignehuesos y Engineer, materialsaliviar el dolor (osteotoma) o para retirar las piezas sueltas de hueso y TEFL teachercartlago. Puede ser necesario el reemplazo de las articulaciones en estadios avanzados de la enfermedad. INSTRUCCIONES PARA EL CUIDADO EN EL HOGAR   Tome los medicamentos solamente como se lo haya indicado el mdico.  Mantenga un peso saludable. Siga las instrucciones del mdico con respecto al control del [place]peso. Esto puede  incluir instrucciones Software engineersobre la dieta.  Practique los ejercicios que le indiquen. Es posible que el mdico le recomiende tipos especficos de ejercicios. Estos pueden incluir:  Ejercicios de fortalecimiento Se realizan para fortalecer los Merrill Lynchmsculos que sostienen las articulaciones afectadas por la artritis. Pueden realizarse con peso o con bandas para agregar resistencia.  Actividades Rhona Raideraerbicas. Son Programmer, applicationsejercicios como caminar a paso ligero, gimnasia Cook Islandsaerbica de bajo impacto, que acelere el corazn.  Actividades de amplitud de movimientos. Dan agilidad a las articulaciones.  Ejercicios de equilibrio y [place] Federationagilidad. Ayudan a McKessonmantener las destrezas que se necesitan para la vida diaria.  Haga descansar a las articulaciones segn las indicaciones del mdico.  Concurra a todas las visitas de control como se lo haya indicado el mdico. SOLICITE ATENCIN MDICA SI:   La piel se pone roja.  Aparece una erupcin adems del dolor en la articulacin.  El dolor en la articulacin empeora.  Tiene fiebre y siente dolor en la articulacin o el msculo. SOLICITE ATENCIN MDICA DE INMEDIATO SI:  Nota una prdida importante de peso o del apetito.  Tiene transpiracin nocturna. PARA Parthenia AmesBTENER MS INFORMACIN   The Krogernstituto Nacional de Artritis y Event organisernfermedades Musculoesquelticas y Dermatolgicas [facility] Russo(National Institute of Arthritis and Musculoskeletal and Skin Diseases): www.niams.http://www.myers.net/nih.gov.  Instituto Lockheed Martinacional sobre el Envejecimiento (General Millsational Institute on Aging): https://walker.com/www.nia.nih.gov.  Instituto Norteamericano de Advice workereumatologa (American College of Rheumatology): www.rheumatology.org. Document Released: 03/07/2005 Document Revised: 10/12/2013 Bridget Russo 2015 UraniaExitCare, Bridget Russo. This Russo is not intended to replace advice given to you by your health care provider. Make sure you discuss any questions you have with your health care provider.

## 2014-07-23 ENCOUNTER — Other Ambulatory Visit: Payer: Self-pay | Admitting: General Practice

## 2014-07-23 DIAGNOSIS — E119 Type 2 diabetes mellitus without complications: Secondary | ICD-10-CM

## 2014-07-23 DIAGNOSIS — E785 Hyperlipidemia, unspecified: Secondary | ICD-10-CM

## 2014-07-23 DIAGNOSIS — M170 Bilateral primary osteoarthritis of knee: Secondary | ICD-10-CM

## 2014-07-23 MED ORDER — MELOXICAM 7.5 MG PO TABS: 7.5000 mg | ORAL_TABLET | Freq: Every day | ORAL | Status: DC

## 2014-07-23 MED ORDER — PRAVASTATIN SODIUM 20 MG PO TABS: 20.0000 mg | ORAL_TABLET | Freq: Every day | ORAL | Status: DC

## 2014-07-23 MED ORDER — METFORMIN HCL 500 MG PO TABS: 500.0000 mg | ORAL_TABLET | Freq: Two times a day (BID) | ORAL | Status: DC

## 2014-07-23 NOTE — Telephone Encounter (Signed)
Patient presents to clinic to request medication refills for the following: meloxicam (MOBIC) 7.5 MG tablet   metFORMIN (GLUCOPHAGE) 500 MG tablet    pravastatin (PRAVACHOL) 20 MG tablet  please assist.

## 2014-07-23 NOTE — Telephone Encounter (Signed)
Refills send to [facility] Pt notified need F/U appointment with PCP (information was given in Spanish)

## 2014-08-09 ENCOUNTER — Ambulatory Visit: Payer: Self-pay | Attending: Internal Medicine | Admitting: Internal Medicine

## 2014-08-09 ENCOUNTER — Encounter: Payer: Self-pay | Admitting: Internal Medicine

## 2014-08-09 VITALS — BP 168/84 | HR 68 | Temp 97.9°F | Resp 18 | Ht 62.0 in | Wt 195.0 lb

## 2014-08-09 DIAGNOSIS — M170 Bilateral primary osteoarthritis of knee: Secondary | ICD-10-CM | POA: Insufficient documentation

## 2014-08-09 DIAGNOSIS — Z23 Encounter for immunization: Secondary | ICD-10-CM | POA: Insufficient documentation

## 2014-08-09 DIAGNOSIS — E785 Hyperlipidemia, unspecified: Secondary | ICD-10-CM | POA: Insufficient documentation

## 2014-08-09 DIAGNOSIS — Z1211 Encounter for screening for malignant neoplasm of colon: Secondary | ICD-10-CM

## 2014-08-09 DIAGNOSIS — J029 Acute pharyngitis, unspecified: Secondary | ICD-10-CM | POA: Insufficient documentation

## 2014-08-09 DIAGNOSIS — E119 Type 2 diabetes mellitus without complications: Secondary | ICD-10-CM | POA: Insufficient documentation

## 2014-08-09 LAB — POCT RAPID STREP A (OFFICE): Rapid Strep A Screen: NEGATIVE

## 2014-08-09 LAB — POCT GLYCOSYLATED HEMOGLOBIN (HGB A1C): Hemoglobin A1C: 6

## 2014-08-09 LAB — GLUCOSE, POCT (MANUAL RESULT ENTRY): POC Glucose: 120 mg/dl — AB (ref 70–99)

## 2014-08-09 MED ORDER — PRAVASTATIN SODIUM 20 MG PO TABS: 20.0000 mg | ORAL_TABLET | Freq: Every day | ORAL | Status: DC

## 2014-08-09 MED ORDER — ACETAMINOPHEN-CODEINE #3 300-30 MG PO TABS: 1.0000 | ORAL_TABLET | ORAL | Status: DC | PRN

## 2014-08-09 MED ORDER — METFORMIN HCL 500 MG PO TABS: 500.0000 mg | ORAL_TABLET | Freq: Two times a day (BID) | ORAL | Status: DC

## 2014-08-09 NOTE — Patient Instructions (Signed)
Plan de alimentacin DASH (DASH Eating Plan) DASH es la sigla en ingls de "Enfoques Alimentarios para Detener la Hipertensin". El plan de alimentacin DASH ha demostrado bajar la presin arterial elevada (hipertensin). Los beneficios adicionales para la salud pueden incluir la disminucin del riesgo de diabetes mellitus tipo2, enfermedades cardacas e ictus. Este plan tambin puede ayudar a Horticulturist, commercial. QU DEBO SABER ACERCA DEL PLAN DE ALIMENTACIN DASH? Para el plan de alimentacin DASH, seguir las siguientes pautas generales:  Elija los alimentos con un valor porcentual diario de sodio de menos del 5% (segn figura en la etiqueta del alimento).  Use hierbas o aderezos sin sal, en lugar de sal de mesa o sal marina.  Consulte al mdico o farmacutico antes de usar sustitutos de la sal.  Coma productos con bajo contenido de sodio, cuya etiqueta suele decir "bajo contenido de sodio" o "sin agregado de sal".  Coma alimentos frescos.  Coma ms verduras, frutas y productos lcteos con bajo contenido de [place].  Elija los cereales integrales. Busque la palabra "integral" en Equities trader de la lista de ingredientes.  Elija el pescado y el pollo o el pavo sin piel ms a menudo que las carnes rojas. Limite el consumo de pescado, carne de ave y carne a 6onzas (170g) por Training and development officer.  Limite el consumo de dulces, postres, azcares y bebidas azucaradas.  Elija las grasas saludables para el corazn.  Limite el consumo de queso a 1onza (28g) por Training and development officer.  Consuma ms comida casera y menos de restaurante, de buf y comida rpida.  Limite el consumo de alimentos fritos.  Cocine los alimentos utilizando mtodos que no sean la fritura.  Limite las verduras enlatadas. Si las consume, enjuguelas bien para disminuir el sodio.  Cuando coma en un restaurante, pida que preparen su comida con menos sal o, en lo posible, sin nada de sal. QU ALIMENTOS PUEDO COMER? Pida ayuda a un nutricionista para  conocer las necesidades calricas individuales. Cereales Pan de salvado o integral. Arroz integral. Pastas de salvado o integrales. Quinua, trigo burgol y cereales integrales. Cereales con bajo contenido de sodio. Tortillas de harina de maz o de salvado. Pan de maz integral. Galletas saladas integrales. Galletas con bajo contenido de [place]. Vegetales Verduras frescas o congeladas (crudas, al vapor, asadas o grilladas). Jugos de tomate y verduras con contenido bajo o reducido de sodio. Pasta y salsa de tomate con contenido bajo o [place]. Verduras enlatadas con bajo contenido de sodio o reducido de sodio.  Lambert Mody Lambert Mody frescas, en conserva (en su jugo natural) o frutas congeladas. Carnes y otros productos con protenas Carne de res molida (al 85% o ms [place]), carne de res de animales alimentados con pastos o carne de res sin la grasa. Pollo o pavo sin piel. Carne de pollo o de [place]. Cerdo sin la grasa. Todos los pescados y frutos de mar. Huevos. Porotos, guisantes o lentejas secos. Frutos secos y semillas sin sal. Frijoles enlatados sin sal. Lcteos Productos lcteos con bajo contenido de grasas, como [place] o al 1%, quesos reducidos en grasas o al 2%, ricota con bajo contenido de grasas o [employer], o yogur natural con bajo contenido de [place]. Quesos con contenido bajo o reducido de sodio. Grasas y Naval architect en barra que no contengan grasas trans. Mayonesa y alios para ensaladas livianos o reducidos en grasas (reducidos en sodio). [place]. Aceites de crtamo, oliva o canola. [place] natural de man o almendra. Otros Palomitas de maz y pretzels sin sal.  Los artculos mencionados arriba pueden no ser [employer] Foods Company de las bebidas o los alimentos recomendados. Comunquese con el nutricionista para conocer ms opciones. QU ALIMENTOS NO SE RECOMIENDAN? Cereales Pan blanco. Pastas blancas. Arroz blanco. Pan de maz refinado. Bagels y  croissants. Galletas saladas que contengan grasas trans. Vegetales Vegetales con crema o fritos. Verduras en [place]. Verduras enlatadas comunes. Pasta y salsa de tomate en lata comunes. Jugos comunes de tomate y de verduras. Lambert Mody Frutas secas. Fruta enlatada en almbar liviano o espeso. Jugo de frutas. Carnes y otros productos con protenas [place] de carne con Lobbyist. Costillas, alas de pollo, tocineta, salchicha, mortadela, salame, chinchulines, tocino, perros calientes, salchichas alemanas y embutidos envasados. Frutos secos y semillas con sal. Frijoles con sal en lata. Lcteos Leche entera o al 2%, crema, mezcla de Pabellones y crema, y queso crema. Yogur entero o endulzado. Quesos o queso azul con alto contenido de Physicist, medical. Cremas no lcteas y coberturas batidas. Quesos procesados, quesos para untar o cuajadas. Condimentos Sal de cebolla y ajo, sal condimentada, sal de mesa y sal marina. Salsas en lata y envasadas. Salsa [place]. Salsa trtara. Salsa barbacoa. Salsa teriyaki. Salsa de soja, incluso la que tiene contenido reducido de [place]. Salsa de carne. Salsa de pescado. Salsa de [place]. Salsa rosada. Rbano picante. Ketchup y mostaza. Saborizantes y tiernizantes para carne. Caldo en cubitos. Salsa picante. Salsa tabasco. Adobos. Aderezos para tacos. Salsas. Grasas y aceites [place], [place] en barra, [place] de [place], [place], [place] clarificada y Wendee Copp de tocino. Aceites de coco, de palmiste o de palma. Aderezos comunes para ensalada. Otros Pickles y [place]. Palomitas de maz y pretzels con sal. Los artculos mencionados arriba pueden no ser [employer] Foods Company de las bebidas y los alimentos que se Higher education careers adviser. Comunquese con el nutricionista para obtener ms informacin. DNDE Dolan Amen MS INFORMACIN? Ferdinand, del Pulmn y de la Sangre (National Heart, Lung, and [place]):  [email or web address] Document Released: 05/17/2011 Document Revised: 10/12/2013 [facility] Patient Information 2015 [place], [place]. This information is not intended to replace advice given to you by your health care provider. Make sure you discuss any questions you have with your health care provider. Recuento bsico de carbohidratos para la diabetes mellitus (Basic Carbohydrate Counting for Diabetes Mellitus) El recuento de carbohidratos es un mtodo destinado a calcular la cantidad de carbohidratos en la dieta. El consumo de carbohidratos aumenta naturalmente el nivel de azcar (glucosa) en la sangre, por lo que es importante que sepa la cantidad que debe incluir en cada comida. El recuento de carbohidratos ayuda a Advertising account executive de glucosa en la sangre dentro de los lmites normales. La cantidad permitida de carbohidratos es diferente para cada persona. Un nutricionista puede ayudarlo a calcular la cantidad adecuada para usted. Una vez que sepa la cantidad de carbohidratos que puede consumir, podr calcular los carbohidratos de los alimentos que desea comer. Los siguientes alimentos incluyen carbohidratos:  Granos, como panes y cereales.  Frijoles secos y productos con soja.  Vegetales almidonados, como papas, guisantes y maz.  Lambert Mody y jugos de frutas.  Leche y Estate agent.  Dulces y bocadillos, como pastel, galletas, caramelos, papas fritas de bolsa, refrescos y bebidas frutales con azcar. RECUENTO DE CARBOHIDRATOS [employer] de calcular los carbohidratos de los alimentos. Puede usar cualquiera de los dos mtodos o [place] combinacin de [place]. Leer la etiqueta de informacin nutricional de los alimentos envasados La informacin nutricional es una etiqueta incluida en casi todas las bebidas y Meyer  alimentos envasados de los Continental AirlinesEstados Unidos. Indica el tamao de la porcin de ese alimento o bebida e informacin sobre los nutrientes de cada porcin, incluso los  gramos (g) de carbohidratos por porcin.  Decida la cantidad de porciones que comer o tomar de este alimento o bebida. Multiplique la cantidad de porciones por el nmero de gramos de carbohidratos indicados en la etiqueta para esa porcin. El total ser la cantidad de carbohidratos que consumir al comer ese alimento o tomar esa bebida. Conocer las porciones estndar de los alimentos Cuando coma alimentos no envasados o que no incluyan la informacin nutricional en la etiqueta, deber medir las porciones para poder calcular la cantidad de carbohidratos. Una porcin de la mayora de los alimentos ricos en carbohidratos contiene alrededor de 15g de carbohidratos. La siguiente World Fuel Services Corporationlista incluye los tamaos de porcin de los alimentos ricos en carbohidratos que contienen alrededor de 15g de carbohidratos por porcin:   1rebanada de pan (1oz) o 1tortilla de seis pulgadas.  panecillo de hamburguesa o bollito tipo ingls.  4a 6galletas.   de taza de cereal sin azcar y seco.   taza de cereal caliente.   de taza de arroz o pastas.  taza de pur de papas o de una papa grande al horno.  1taza de frutas frescas o una fruta pequea.  taza de frutas o jugo de frutas enlatados o congelados.  1 taza AutoZonede leche.   de taza de yogur descremado sin ningn agregado o de yogur endulzado con edulcorante artificial.  taza de vegetales almidonados, como guisantes, maz o papas, o de frijoles secos cocidos. Decida la cantidad de porciones Advertising copywriterestndar que comer. Multiplique la cantidad de porciones por 15 (los gramos de carbohidratos en esa porcin). Por ejemplo, si come 2tazas de fresas, habr comido 2porciones y 30g de carbohidratos (2porciones x 15g = 30g). Para las comidas como sopas y guisos, en las que se mezcla ms de un alimento, deber Par[place] Northern Santa Fecontar los carbohidratos de cada alimento incluido. EJEMPLO DE RECUENTO DE CARBOHIDRATOS Ejemplo de cena  3 onzas de pechugas de pollo.   de taza  de arroz integral.   taza de maz.  1 taza de [place].  1 taza de fresas con crema batida sin azcar. Clculo de carbohidratos [place] 1: Identifique los alimentos que contienen carbohidratos:   Arroz.  Maz.  Leche.  Jinny SandersFresas. [place] 2: Calcule el nmero de porciones que consumir de cada uno:   2 porciones de [name], mineralsarroz.  1 porcin de maz.  1 porcin de leche.  1 porcin de fresas. [place] 3: Multiplique cada una de esas porciones por 15g:   2 porciones de arroz x 15 g = 30 g.  1 porcin de maz x 15 g = 15 g.  1 porcin de leche x 15 g = 15 g.  1 porcin de fresas x 15 g = 15 g. [place] 4: Sume todas las cantidades para Artistconocer el total de gramos de carbohidratos consumidos: 30 g + 15 g + 15 g + 15 g = 75 g. Document Released: 08/20/2011 Document Revised: 10/12/2013 [facility] Patient Information 2015 [place], [name]. This information is not intended to replace advice given to you by your health care provider. Make sure you discuss any questions you have with your health care provider. Diabetes y Doroteo Glassmanactividad fsica (Diabetes and Exercise) Hacer actividad fsica con regularidad es muy importante. No se trata solo de Johnson Controlsperder peso. Tiene muchos otros beneficios, como por ejemplo:  Mejorar el estado fsico, la flexibilidad y la resistencia.  Aumenta la densidad sea.  Ayuda a Art gallery managercontrolar el peso.  Disminuye la Art gallery managergrasa corporal.  Aumenta la fuerza muscular.  Reduce el estrs y las tensiones.  Mejora el estado de salud general. Las personas diabticas que realizan actividad fsica tienen beneficios adicionales debido al ejercicio:  Reduce el apetito.  El organismo mejora el uso del azcar (glucosa) de la [place] Gardenssangre.  Ayuda a disminuir o Engineer, maintenance (IT)controlar la glucosa en la sangre.  Disminuye la presin arterial.  Ayuda a disminuir los lpidos en la sangre (colesterol y triglicridos).  El organismo mejora el uso de la insulina porque:  Aumenta la sensibilidad del organismo a la  insulina.  Reduce las necesidades de insulina del organismo.  Disminuye el riesgo de enfermedad cardaca por la actividad fsica ya que  disminuye el colesterol y TEPPCO Partnerstambin los triglicridos.  Aumenta los niveles de colesterol bueno (como las lipoprotenas de alta densidad [HDL]) en el organismo.  Disminuye los niveles de glucosa en la Beale AFBsangre. SU PLAN DE ACTIVIDAD  Elija una actividad que disfrute y establezca objetivos realistas. Su mdico o educador en diabetes podrn ayudarlo a encontrar una actividad que lo beneficie. Haga ejercicio regularmente como se lo haya indicado el mdico. Esto incluye:  Hacer entrenamiento de Northrop Grummanresistencia dos veces a la semana, como flexiones, sentadillas, levantar peso o usar bandas de resistencia.  Practicar 150minutos de ejercicios cardiovasculares cada semana, como caminar, correr o hacer algn deporte.  Mantenerse activo y no permanecer inactivo durante ms de 90minutos seguidos. Los perodos cortos de Saint Vincent and the Grenadinesactividad tambin son beneficiosos. Tres sesiones de 10minutos a lo largo del da son tan beneficiosas como una sola sesin de 30minutos. Estas son algunas ideas para los ejercicios:  Lleve a Multimedia programmerpasear el perro.  Utilice las Microbiologistescaleras en lugar del ascensor.  Baile su cancin favorita.  Haga los ejercicios de un video de ejercicios.  Haga sus ejercicios favoritos con Leisure centre managerun amigo. RECOMENDACIONES PARA REALIZAR EJERCICIOS CUANDO SE TIENE DIABETES TIPO 1 O TIPO 2   Controle la glucosa en la sangre antes de comenzar. Si el nivel de glucosa en la sangre es de ms de 240 mg/dl, controle las cetonas en la [place]orina. No haga actividad fsica si hay cetonas.  Evite inyectarse insulina en las zonas del cuerpo que ejercitar. Por ejemplo, evite inyectarse insulina en:  Los brazos, si juega al tenis.  Las piernas, si corre.  Lleve un registro de:  Los alimentos que consume antes y despus de Tour managerhacer el ejercicio.  Los momentos esperables de picos de accin de la  insulina.  Los niveles de glucosa en la sangre antes y despus de hacer ejercicios.  El tipo y cantidad de [place] Vincent and the Grenadinesactividad fsica que Biomedical engineerrealiza.  Revise los registros con su mdico. El mdico lo ayudar a Environmental education officerdesarrollar pautas para ajustar la cantidad de alimento y las cantidades de insulina antes y despus de Radio producerhacer ejercicios.  Si toma insulina o agentes hipoglucemiantes por va oral, observe si hay signos y sntomas de hipoglucemia. Entre los que se incluyen:  Mareos.  Temblores.  Sudoracin.  Escalofros.  Confusin.  Beba gran cantidad de agua mientras hace ejercicios para evitar la deshidratacin o los golpes de Airline pilotcalor. Durante la actividad fsica se pierde agua corporal que se debe reponer.  Comente con su mdico antes de comenzar un programa de actividad fsica para verificar que sea seguro para usted. Recuerde, cualquier actividad es mejor que ninguna. Document Released: 06/17/2007 Document Revised: 10/12/2013 [facility] Patient Information 2015 [place]ExitCare, [name]. This information is not intended to replace advice given to you  by your health care provider. Make sure you discuss any questions you have with your health care provider. Hipertensin (Hypertension) La hipertensin, conocida comnmente como presin arterial alta, se produce cuando la sangre bombea en las arterias con mucha fuerza. Las arterias son los vasos sanguneos que transportan la sangre desde el corazn hacia todas las partes del cuerpo. Una lectura de la presin arterial consiste en un nmero ms alto sobre un nmero ms bajo, por ejemplo, 110/72. El nmero ms alto (presin sistlica) corresponde a la presin interna de las arterias cuando el corazn [place]. El nmero ms bajo (presin diastlica) corresponde a la presin interna de las arterias cuando el corazn se relaja. En condiciones ideales, la presin arterial debe ser inferior a 120/80. La hipertensin fuerza al corazn a trabajar ms para Marine scientist. Las  arterias pueden estrecharse o ponerse rgidas. La hipertensin conlleva el riesgo de enfermedad cardaca, ictus y otros problemas.  FACTORES DE RIESGO Algunos factores de riesgo de hipertensin son controlables, pero otros no lo son.  Dynegy factores de riesgo que usted no puede Chief Operating Officer, se incluyen:   Nurse, learning disability. El riesgo es mayor para las Statistician.  La edad. Los riesgos aumentan con la edad.  El sexo. Antes de los 45aos, los hombres corren ms [employer]. Despus de los 65aos, las mujeres corren ms [employer]. Entre los factores de riesgo que usted puede Chief Operating Officer, se incluyen:  No hacer la cantidad suficiente de actividad fsica o ejercicio.  Tener sobrepeso.  Consumir mucha grasa, azcar, caloras o sal en la dieta.  Beber alcohol en exceso. SIGNOS Y SNTOMAS Por lo general, la hipertensin no causa signos o sntomas. La hipertensin demasiado alta (crisis hipertensiva) puede causar dolor de cabeza, ansiedad, falta de aire y hemorragia nasal. DIAGNSTICO  Para detectar si usted tiene hipertensin, el mdico le medir la presin arterial mientras est sentado, con el brazo levantado a la altura del corazn. Debe medirla al [facility] veces en el mismo brazo. Determinadas condiciones pueden causar una diferencia de presin arterial entre el brazo izquierdo y Aeronautical engineer. El hecho de tener una sola lectura de la presin arterial ms alta que lo normal no significa que Research scientist (physical sciences). En el caso de tener una lectura de la presin arterial con un valor alto, pdale al mdico que la verifique nuevamente. TRATAMIENTO  El tratamiento de la hipertensin arterial incluye hacer cambios en el estilo de vida y, posiblemente, tomar medicamentos. Un estilo de vida saludable puede ayudar a bajar la presin arterial alta. Quiz deba cambiar algunos hbitos. Los [employer]orporated en el estilo de vida pueden incluir:  Seguir la dieta DASH. Esta dieta tiene un  alto contenido de frutas, verduras y Radiation protection practitioner. Incluye poca cantidad de sal, carnes rojas y azcares agregados.  Hacer al menos 2horas de actividad fsica enrgica todas las semanas.  Perder peso, si es necesario.  No fumar.  Limitar el consumo de bebidas alcohlicas.  Aprender formas de reducir el estrs. Si los cambios en el estilo de vida no son suficientes para Museum/gallery curator la presin arterial, el mdico puede recetarle medicamentos. Quiz necesite tomar ms de uno. Trabaje en conjunto con su mdico para comprender los riesgos y los beneficios. INSTRUCCIONES PARA EL CUIDADO EN EL HOGAR  Haga que le midan de nuevo la presin arterial segn las indicaciones del mdico.  Tome los medicamentos solamente como se lo haya indicado el mdico. Siga cuidadosamente las indicaciones. Los medicamentos para la  presin arterial deben tomarse segn las indicaciones. Los medicamentos pierden eficacia al omitir las dosis. El hecho de omitir las dosis tambin [place] el riesgo de otros problemas.  No fume.  Contrlese la presin arterial en su casa segn las indicaciones del mdico. SOLICITE ATENCIN MDICA SI:   Piensa que tiene una reaccin alrgica a los medicamentos.  Tiene mareos o dolores de cabeza con Naval architect.  Tiene hinchazn en los tobillos.  Tiene problemas de visin. SOLICITE ATENCIN MDICA DE INMEDIATO SI:  Siente un dolor de cabeza intenso o confusin.  Siente debilidad inusual, adormecimiento o que Hospital doctor.  Siente dolor intenso en el pecho o en el abdomen.  Vomita repetidas veces.  Tiene dificultad para respirar. ASEGRESE DE QUE:   Comprende estas instrucciones.  Controlar su afeccin.  Recibir ayuda de inmediato si no mejora o si empeora. Document Released: 05/28/2005 Document Revised: 10/12/2013 [facility] Patient Information 2015 [place], [place]. This information is not intended to replace advice given to you by your health care  provider. Make sure you discuss any questions you have with your health care provider.

## 2014-08-09 NOTE — Progress Notes (Signed)
Pt comes in for 3 month f/u DM, Cholesterol with med management States she is compliant with taking Metformin, Pravastatin as prescribed C/o numbness/tingling in hands and feet C/o bilat knee pain C/o sore throat x 1 week,denies diff swallowing or ear pain CBG- 120 Health Maintenance: Flu/PNA vaccine Mammogram Pap smear Pacific interpretor used

## 2014-08-09 NOTE — Addendum Note (Signed)
Addended by: Nonnie DoneSMITH, Arcenio Mullaly D on: 08/09/2014 11:45 AM   Modules accepted: Orders

## 2014-08-09 NOTE — Progress Notes (Signed)
Patient ID: Bridget Russo, female   DOB: 1950-06-26, 64 y.o.   MRN: [id number]   Bridget Russo, is a 64 y.o. female  [id number]  [id number]  DOB - [birth year]  Chief Complaint  Patient presents with  . Follow-up  . Diabetes  . Hyperlipidemia  . Sore Throat        Subjective:   Bridget Russo is a 64 y.o. female here today for a follow up visit. Patient has history of hypertension, diabetes mellitus and hyperlipidemia on medications as listed below, here today for routine follow-up. She has complained of sore throat for the past 8 days which is getting better, yesterday she started to talk clearly after a few days of hoarse voice. She has no fever. She has no headache. She is compliant with medications and diet. He reports no side effects to medications. She took some over-the-counter medication that may have affected her blood pressure according to patient. She continues to have mild-to-moderate pain in her knees. Her last mammogram was normal. She is due for colonoscopy and Pap smear. She will get a flu shot and pneumonia shot today. Patient has No headache, No chest pain, No abdominal pain - No Nausea, No new weakness tingling or numbness, No Cough - SOB.  No problems updated.  ALLERGIES: No Known Allergies  PAST MEDICAL HISTORY: Past Medical History  Diagnosis Date  . Arthritis   . Type 2 diabetes mellitus without complication 01/18/2014    MEDICATIONS AT HOME: Prior to Admission medications   Medication Sig Start Date End Date Taking? Authorizing Provider  metFORMIN (GLUCOPHAGE) 500 MG tablet Take 1 tablet (500 mg total) by mouth 2 (two) times daily with a meal. 08/09/14  Yes Quentin Angst, MD  pravastatin (PRAVACHOL) 20 MG tablet Take 1 tablet (20 mg total) by mouth daily. 08/09/14  Yes Quentin Angst, MD  acetaminophen-codeine (TYLENOL #3) 300-30 MG per tablet Take 1 tablet by mouth every 4 (four) hours as needed. 08/09/14   Quentin Angst, MD  meloxicam  (MOBIC) 7.5 MG tablet Take 1 tablet (7.5 mg total) by mouth daily. Patient not taking: Reported on 08/09/2014 07/23/14   Quentin Angst, MD     Objective:   Filed Vitals:   08/09/14 0921  BP: 168/84  Pulse: 68  Temp: 97.9 F (36.6 C)  TempSrc: Oral  Resp: 18  Height:  (1.575 m)  Weight: 195 lb (88.451 kg)  SpO2: 96%    Exam General appearance : Awake, alert, not in any distress. Speech Clear. Not toxic looking HEENT: Atraumatic and Normocephalic, pupils equally reactive to light and accomodation Neck: supple, no JVD. No cervical lymphadenopathy.  Chest:Good air entry bilaterally, no added sounds  CVS: S1 S2 regular, no murmurs.  Abdomen: Bowel sounds present, Non tender and not distended with no gaurding, rigidity or rebound. Extremities: B/L Lower Ext shows no edema, both legs are warm to touch Neurology: Awake alert, and oriented X 3, CN II-XII intact, Non focal  Data Review Lab Results  Component Value Date   HGBA1C 6.0 08/09/2014   HGBA1C 6.3 05/04/2014   HGBA1C 6.2 01/18/2014     Assessment & Plan   1. Type 2 diabetes mellitus without complication  - POCT glycosylated hemoglobin (Hb A1C) is 6.0% today - Glucose (CBG) - metFORMIN (GLUCOPHAGE) 500 MG tablet; Take 1 tablet (500 mg total) by mouth 2 (two) times daily with a meal.  Dispense: 180 tablet; Refill: 3 - COMPLETE METABOLIC PANEL WITH GFR  2. Dyslipidemia  -  Lipid panel - pravastatin (PRAVACHOL) 20 MG tablet; Take 1 tablet (20 mg total) by mouth daily.  Dispense: 90 tablet; Refill: 3  3. Primary osteoarthritis of both knees  - Vit D  25 hydroxy (rtn osteoporosis monitoring) - acetaminophen-codeine (TYLENOL #3) 300-30 MG per tablet; Take 1 tablet by mouth every 4 (four) hours as needed.  Dispense: 60 tablet; Refill: 0  4. Colon cancer screening  - HM COLONOSCOPY - Ambulatory referral to Gastroenterology  5. Sore Throat: Improving - Continue symptomatic treatment - Rapid Strep  A   Patient was counseled extensively about nutrition and exercise. Interpreter was used to communicate directly with patient for the entire encounter including providing detailed patient instructions.   Return in about 3 months (around 11/07/2014), or if symptoms worsen or fail to improve, for Hemoglobin A1C and Follow up, DM, Follow up HTN, Annual Physical, Pap Smear.  The patient was given clear instructions to go to ER or return to medical center if symptoms don't improve, worsen or new problems develop. The patient verbalized understanding. The patient was told to call to get lab results if they haven't heard anything in the next week.   This note has been created with Education officer, environmentalDragon speech recognition software and smart phrase technology. Any transcriptional errors are unintentional.    Jeanann LewandowskyJEGEDE, Bridget Hulse, MD, MHA, FACP, FAAP [facility] [place], [place] [phone]   08/09/2014, 9:56 AM

## 2014-08-10 LAB — COMPLETE METABOLIC PANEL WITH GFR
ALT: 18 U/L (ref 0–35)
AST: 19 U/L (ref 0–37)
Albumin: 4.2 g/dL (ref 3.5–5.2)
Alkaline Phosphatase: 59 U/L (ref 39–117)
BILIRUBIN TOTAL: 0.4 mg/dL (ref 0.2–1.2)
BUN: 13 mg/dL (ref 6–23)
CO2: 22 mEq/L (ref 19–32)
CREATININE: 0.57 mg/dL (ref 0.50–1.10)
Calcium: 9.5 mg/dL (ref 8.4–10.5)
Chloride: 100 mEq/L (ref 96–112)
GLUCOSE: 104 mg/dL — AB (ref 70–99)
Potassium: 4.4 mEq/L (ref 3.5–5.3)
Sodium: 138 mEq/L (ref 135–145)
Total Protein: 7.2 g/dL (ref 6.0–8.3)

## 2014-08-10 LAB — VITAMIN D 25 HYDROXY (VIT D DEFICIENCY, FRACTURES): VIT D 25 HYDROXY: 25 ng/mL — AB (ref 30–100)

## 2014-08-10 LAB — LIPID PANEL
CHOL/HDL RATIO: 2.7 ratio
CHOLESTEROL: 194 mg/dL (ref 0–200)
HDL: 73 mg/dL (ref >=46)
LDL CALC: 77 mg/dL (ref 0–99)
Triglycerides: 222 mg/dL — ABNORMAL HIGH (ref <150)
VLDL: 44 mg/dL — ABNORMAL HIGH (ref 0–40)

## 2014-08-19 ENCOUNTER — Telehealth: Payer: Self-pay | Admitting: Emergency Medicine

## 2014-08-19 NOTE — Telephone Encounter (Signed)
Will call with spanish interpretor

## 2014-08-19 NOTE — Telephone Encounter (Signed)
-----   Message from Quentin Angstlugbemiga E Jegede, MD sent at 08/18/2014  5:46 PM EST ----- Please inform patient that her laboratory test results are mostly within normal limits. Her vitamin D is slightly low, will recommend over-the-counter vitamin D and calcium supplement daily

## 2014-08-30 ENCOUNTER — Ambulatory Visit: Payer: Self-pay | Attending: Internal Medicine

## 2014-09-14 ENCOUNTER — Telehealth: Payer: Self-pay | Admitting: Internal Medicine

## 2014-09-14 NOTE — Telephone Encounter (Signed)
Pt came into clinic requesting medication refill for

## 2014-09-14 NOTE — Telephone Encounter (Signed)
Pt came into clinic requesting medication refill for Tylenol #3 . Pt was given rx at visit but was unable to get at the pharmacy. Please f/u with pt

## 2014-10-04 ENCOUNTER — Telehealth: Payer: Self-pay | Admitting: Internal Medicine

## 2014-10-04 NOTE — Telephone Encounter (Signed)
Pt is calling to inquire about her referral to the colonoscopist.

## 2014-10-04 NOTE — Telephone Encounter (Signed)
Pt received a call last Friday the 22nd but the voicemail was left in english and she did not understand. Please follow up with patient.

## 2014-10-05 NOTE — Telephone Encounter (Signed)
Pt don’t have insurance I send a letter to patient with application to apply for the [facility] discount to be refer to a Gi specialist.  °

## 2014-10-29 ENCOUNTER — Telehealth: Payer: Self-pay

## 2014-10-29 NOTE — Telephone Encounter (Signed)
Patient called per Delorise RoyalsJulie Sowell to follow up on whether patient had received community resources for Diabetes and HTN. Patient stated that was doing good and felt had received proper education on conditions.

## 2014-11-02 ENCOUNTER — Telehealth: Payer: Self-pay | Admitting: Internal Medicine

## 2014-11-02 ENCOUNTER — Other Ambulatory Visit: Payer: Self-pay | Admitting: *Deleted

## 2014-11-02 NOTE — Telephone Encounter (Signed)
Patient came into clinic requesting medication refill for pravastatin (PRAVACHOL) 20 MG tablet, metFORMIN (GLUCOPHAGE) 500 MG tablet and meloxicam (MOBIC) 7.5 MG tablet. Patient will be traveling out of the [place] 1 month and would like to have enough medication to last her. Please f/u with pt

## 2014-12-06 ENCOUNTER — Encounter: Payer: Self-pay | Admitting: *Deleted

## 2014-12-23 ENCOUNTER — Encounter: Payer: Self-pay | Admitting: Internal Medicine

## 2014-12-23 ENCOUNTER — Ambulatory Visit: Payer: Self-pay | Attending: Internal Medicine | Admitting: Internal Medicine

## 2014-12-23 VITALS — BP 129/80 | HR 65 | Temp 98.0°F | Resp 18 | Ht 62.0 in | Wt 201.0 lb

## 2014-12-23 DIAGNOSIS — Z1211 Encounter for screening for malignant neoplasm of colon: Secondary | ICD-10-CM | POA: Insufficient documentation

## 2014-12-23 DIAGNOSIS — Z79899 Other long term (current) drug therapy: Secondary | ICD-10-CM | POA: Insufficient documentation

## 2014-12-23 DIAGNOSIS — M170 Bilateral primary osteoarthritis of knee: Secondary | ICD-10-CM | POA: Insufficient documentation

## 2014-12-23 DIAGNOSIS — E785 Hyperlipidemia, unspecified: Secondary | ICD-10-CM | POA: Insufficient documentation

## 2014-12-23 DIAGNOSIS — Z1239 Encounter for other screening for malignant neoplasm of breast: Secondary | ICD-10-CM

## 2014-12-23 DIAGNOSIS — E119 Type 2 diabetes mellitus without complications: Secondary | ICD-10-CM | POA: Insufficient documentation

## 2014-12-23 LAB — POCT GLYCOSYLATED HEMOGLOBIN (HGB A1C): Hemoglobin A1C: 6.4

## 2014-12-23 LAB — GLUCOSE, POCT (MANUAL RESULT ENTRY): POC GLUCOSE: 100 mg/dL — AB (ref 70–99)

## 2014-12-23 MED ORDER — METFORMIN HCL 500 MG PO TABS: 500.0000 mg | ORAL_TABLET | Freq: Two times a day (BID) | ORAL | Status: DC

## 2014-12-23 MED ORDER — MELOXICAM 7.5 MG PO TABS: 7.5000 mg | ORAL_TABLET | Freq: Every day | ORAL | Status: DC

## 2014-12-23 MED ORDER — PRAVASTATIN SODIUM 20 MG PO TABS: 20.0000 mg | ORAL_TABLET | Freq: Every day | ORAL | Status: DC

## 2014-12-23 NOTE — Progress Notes (Signed)
Patient here for follow up. Patient reports pain in her knees rated at a 7 described as burning. Patient reports her knees hurt a lot during the night. Patient reports the burning sensation started about 4 months ago. Patient does not take pain medication for it because they have not seemed to help, rather, she uses ointment and ice at night.   Patient needs refills on metformin, pravastatin, and meloxicam.  Patient CBG is 100. A1C is 6.4%.

## 2014-12-23 NOTE — Patient Instructions (Signed)
Plan de alimentacin DASH (DASH Eating Plan) DASH es la sigla en ingls de "Enfoques Alimentarios para Detener la Hipertensin". El plan de alimentacin DASH ha demostrado bajar la presin arterial elevada (hipertensin). Los beneficios adicionales para la salud pueden incluir la disminucin del riesgo de diabetes mellitus tipo2, enfermedades cardacas e ictus. Este plan tambin puede ayudar a Horticulturist, commercial. QU DEBO SABER ACERCA DEL PLAN DE ALIMENTACIN DASH? Para el plan de alimentacin DASH, seguir las siguientes pautas generales:  Elija los alimentos con un valor porcentual diario de sodio de menos del 5% (segn figura en la etiqueta del alimento).  Use hierbas o aderezos sin sal, en lugar de sal de mesa o sal marina.  Consulte al mdico o farmacutico antes de usar sustitutos de la sal.  Coma productos con bajo contenido de sodio, cuya etiqueta suele decir "bajo contenido de sodio" o "sin agregado de sal".  Coma alimentos frescos.  Coma ms verduras, frutas y productos lcteos con bajo contenido de [place].  Elija los cereales integrales. Busque la palabra "integral" en Equities trader de la lista de ingredientes.  Elija el pescado y el pollo o el pavo sin piel ms a menudo que las carnes rojas. Limite el consumo de pescado, carne de ave y carne a 6onzas (170g) por Training and development officer.  Limite el consumo de dulces, postres, azcares y bebidas azucaradas.  Elija las grasas saludables para el corazn.  Limite el consumo de queso a 1onza (28g) por Training and development officer.  Consuma ms comida casera y menos de restaurante, de buf y comida rpida.  Limite el consumo de alimentos fritos.  Cocine los alimentos utilizando mtodos que no sean la fritura.  Limite las verduras enlatadas. Si las consume, enjuguelas bien para disminuir el sodio.  Cuando coma en un restaurante, pida que preparen su comida con menos sal o, en lo posible, sin nada de sal. QU ALIMENTOS PUEDO COMER? Pida ayuda a un nutricionista para  conocer las necesidades calricas individuales. Cereales Pan de salvado o integral. Arroz integral. Pastas de salvado o integrales. Quinua, trigo burgol y cereales integrales. Cereales con bajo contenido de sodio. Tortillas de harina de maz o de salvado. Pan de maz integral. Galletas saladas integrales. Galletas con bajo contenido de [place]. Vegetales Verduras frescas o congeladas (crudas, al vapor, asadas o grilladas). Jugos de tomate y verduras con contenido bajo o reducido de sodio. Pasta y salsa de tomate con contenido bajo o [place]. Verduras enlatadas con bajo contenido de sodio o reducido de sodio.  Lambert Mody Lambert Mody frescas, en conserva (en su jugo natural) o frutas congeladas. Carnes y otros productos con protenas Carne de res molida (al 85% o ms [place]), carne de res de animales alimentados con pastos o carne de res sin la grasa. Pollo o pavo sin piel. Carne de pollo o de [place]. Cerdo sin la grasa. Todos los pescados y frutos de mar. Huevos. Porotos, guisantes o lentejas secos. Frutos secos y semillas sin sal. Frijoles enlatados sin sal. Lcteos Productos lcteos con bajo contenido de grasas, como [place] o al 1%, quesos reducidos en grasas o al 2%, ricota con bajo contenido de grasas o [employer], o yogur natural con bajo contenido de [place]. Quesos con contenido bajo o reducido de sodio. Grasas y Naval architect en barra que no contengan grasas trans. [place]ones[place] y alios para ensaladas livianos o reducidos en grasas (reducidos en sodio). [place]. Aceites de crtamo, oliva o canola. [place] natural de man o almendra. Otros Palomitas de maz y pretzels sin sal.  Los artculos mencionados arriba pueden no ser [employer] de las bebidas o los alimentos recomendados. Comunquese con el nutricionista para conocer ms opciones. QU ALIMENTOS NO SE RECOMIENDAN? Cereales Pan blanco. Pastas blancas. Arroz blanco. Pan de maz refinado. Bagels y  croissants. Galletas saladas que contengan grasas trans. Vegetales Vegetales con crema o fritos. Verduras en [place]. Verduras enlatadas comunes. Pasta y salsa de tomate en lata comunes. Jugos comunes de tomate y de verduras. Lambert Mody Frutas secas. Fruta enlatada en almbar liviano o espeso. Jugo de frutas. Carnes y otros productos con protenas [place] de carne con Lobbyist. Costillas, alas de pollo, tocineta, salchicha, mortadela, salame, chinchulines, tocino, perros calientes, salchichas alemanas y embutidos envasados. Frutos secos y semillas con sal. Frijoles con sal en lata. Lcteos Leche entera o al 2%, crema, mezcla de Pabellones y crema, y queso crema. Yogur entero o endulzado. Quesos o queso azul con alto contenido de Physicist, medical. Cremas no lcteas y coberturas batidas. Quesos procesados, quesos para untar o cuajadas. Condimentos Sal de cebolla y ajo, sal condimentada, sal de mesa y sal marina. Salsas en lata y envasadas. Salsa [place]. Salsa trtara. Salsa barbacoa. Salsa teriyaki. Salsa de soja, incluso la que tiene contenido reducido de [place]. Salsa de carne. Salsa de pescado. Salsa de [place]. Salsa rosada. Rbano picante. Ketchup y mostaza. Saborizantes y tiernizantes para carne. Caldo en cubitos. Salsa picante. Salsa tabasco. Adobos. Aderezos para tacos. Salsas. Grasas y aceites [place], [place] en barra, [place] de [place], [place], [place] clarificada y Wendee Copp de tocino. Aceites de coco, de palmiste o de palma. Aderezos comunes para ensalada. Otros Pickles y [place]. Palomitas de maz y pretzels con sal. Los artculos mencionados arriba pueden no ser [employer] de las bebidas y los alimentos que se Higher education careers adviser. Comunquese con el nutricionista para obtener ms informacin. DNDE Dolan Amen MS INFORMACIN? Ferdinand, del Pulmn y de la Sangre (National Heart, Lung, and [place]):  [email or web address] Document Released: 05/17/2011 Document Revised: 10/12/2013 [facility] Patient Information 2015 [place], [place]. This information is not intended to replace advice given to you by your health care provider. Make sure you discuss any questions you have with your health care provider. Recuento bsico de carbohidratos para la diabetes mellitus (Basic Carbohydrate Counting for Diabetes Mellitus) El recuento de carbohidratos es un mtodo destinado a calcular la cantidad de carbohidratos en la dieta. El consumo de carbohidratos aumenta naturalmente el nivel de azcar (glucosa) en la sangre, por lo que es importante que sepa la cantidad que debe incluir en cada comida. El recuento de carbohidratos ayuda a Advertising account executive de glucosa en la sangre dentro de los lmites normales. La cantidad permitida de carbohidratos es diferente para cada persona. Un nutricionista puede ayudarlo a calcular la cantidad adecuada para usted. Una vez que sepa la cantidad de carbohidratos que puede consumir, podr calcular los carbohidratos de los alimentos que desea comer. Los siguientes alimentos incluyen carbohidratos:  Granos, como panes y cereales.  Frijoles secos y productos con soja.  Vegetales almidonados, como papas, guisantes y maz.  Lambert Mody y jugos de frutas.  Leche y Estate agent.  Dulces y bocadillos, como pastel, galletas, caramelos, papas fritas de bolsa, refrescos y bebidas frutales con azcar. RECUENTO DE CARBOHIDRATOS [employer] de calcular los carbohidratos de los alimentos. Puede usar cualquiera de los dos mtodos o [place] combinacin de [place]. Leer la etiqueta de informacin nutricional de los alimentos envasados La informacin nutricional es una etiqueta incluida en casi todas las bebidas y Meyer  alimentos envasados de los [place]. Indica el tamao de la porcin de ese alimento o bebida e informacin sobre los nutrientes de cada porcin, incluso los  gramos (g) de carbohidratos por porcin.  Decida la cantidad de porciones que comer o tomar de este alimento o bebida. Multiplique la cantidad de porciones por el nmero de gramos de carbohidratos indicados en la etiqueta para esa porcin. El total ser la cantidad de carbohidratos que consumir al comer ese alimento o tomar esa bebida. Conocer las porciones estndar de los alimentos Cuando coma alimentos no envasados o que no incluyan la informacin nutricional en la etiqueta, deber medir las porciones para poder calcular la cantidad de carbohidratos. Una porcin de la mayora de los alimentos ricos en carbohidratos contiene alrededor de 15g de carbohidratos. La siguiente lista incluye los tamaos de porcin de los alimentos ricos en carbohidratos que contienen alrededor de 15g de carbohidratos por porcin:   1rebanada de pan (1oz) o 1tortilla de seis pulgadas.  panecillo de hamburguesa o bollito tipo ingls.  4a 6galletas.   de taza de cereal sin azcar y seco.   taza de cereal caliente.   de taza de arroz o pastas.  taza de pur de papas o de una papa grande al horno.  1taza de frutas frescas o una fruta pequea.  taza de frutas o jugo de frutas enlatados o congelados.  1 taza de leche.   de taza de yogur descremado sin ningn agregado o de yogur endulzado con edulcorante artificial.  taza de vegetales almidonados, como guisantes, maz o papas, o de frijoles secos cocidos. Decida la cantidad de porciones estndar que comer. Multiplique la cantidad de porciones por 15 (los gramos de carbohidratos en esa porcin). Por ejemplo, si come 2tazas de fresas, habr comido 2porciones y 30g de carbohidratos (2porciones x 15g = 30g). Para las comidas como sopas y guisos, en las que se mezcla ms de un alimento, deber contar los carbohidratos de cada alimento incluido. EJEMPLO DE RECUENTO DE CARBOHIDRATOS Ejemplo de cena  3 onzas de pechugas de pollo.   de taza  de arroz integral.   taza de maz.  1 taza de leche.  1 taza de fresas con crema batida sin azcar. Clculo de carbohidratos [place] 1: Identifique los alimentos que contienen carbohidratos:   Arroz.  Maz.  Leche.  Fresas. [place] 2: Calcule el nmero de porciones que consumir de cada uno:   2 porciones de arroz.  1 porcin de maz.  1 porcin de leche.  1 porcin de fresas. Paso 3: Multiplique cada una de esas porciones por 15g:   2 porciones de arroz x 15 g = 30 g.  1 porcin de maz x 15 g = 15 g.  1 porcin de leche x 15 g = 15 g.  1 porcin de fresas x 15 g = 15 g. Paso 4: Sume todas las cantidades para conocer el total de gramos de carbohidratos consumidos: 30 g + 15 g + 15 g + 15 g = 75 g. Document Released: 08/20/2011 Document Revised: 10/12/2013 [name] 2015 ExitCare, LLC. This information is not intended to replace advice given to you by your health care provider. Make sure you discuss any questions you have with your health care provider. Diabetes y actividad fsica (Diabetes and Exercise) Hacer actividad fsica con regularidad es muy importante. No se trata solo de perder peso. Tiene muchos otros beneficios, como por ejemplo:  Mejorar el estado fsico, la flexibilidad y la resistencia.    Aumenta la densidad sea.  Ayuda a Art gallery managercontrolar el peso.  Disminuye la Art gallery managergrasa corporal.  Aumenta la fuerza muscular.  Reduce el estrs y las tensiones.  Mejora el estado de salud general. Las personas diabticas que realizan actividad fsica tienen beneficios adicionales debido al ejercicio:  Reduce el apetito.  El organismo mejora el uso del azcar (glucosa) de la [place] Gardenssangre.  Ayuda a disminuir o Engineer, maintenance (IT)controlar la glucosa en la sangre.  Disminuye la presin arterial.  Ayuda a disminuir los lpidos en la sangre (colesterol y triglicridos).  El organismo mejora el uso de la insulina porque:  Aumenta la sensibilidad del organismo a la  insulina.  Reduce las necesidades de insulina del organismo.  Disminuye el riesgo de enfermedad cardaca por la actividad fsica ya que  disminuye el colesterol y TEPPCO Partnerstambin los triglicridos.  Aumenta los niveles de colesterol bueno (como las lipoprotenas de alta densidad [HDL]) en el organismo.  Disminuye los niveles de glucosa en la Beale AFBsangre. SU PLAN DE ACTIVIDAD  Elija una actividad que disfrute y establezca objetivos realistas. Su mdico o educador en diabetes podrn ayudarlo a encontrar una actividad que lo beneficie. Haga ejercicio regularmente como se lo haya indicado el mdico. Esto incluye:  Hacer entrenamiento de Northrop Grummanresistencia dos veces a la semana, como flexiones, sentadillas, levantar peso o usar bandas de resistencia.  Practicar 150minutos de ejercicios cardiovasculares cada semana, como caminar, correr o hacer algn deporte.  Mantenerse activo y no permanecer inactivo durante ms de 90minutos seguidos. Los perodos cortos de [place] Vincent and the Grenadinesactividad tambin son beneficiosos. Tres sesiones de 10minutos a lo largo del da son tan beneficiosas como una sola sesin de 30minutos. Estas son algunas ideas para los ejercicios:  Lleve a Multimedia programmerpasear el perro.  Utilice las Microbiologistescaleras en lugar del ascensor.  Baile su cancin favorita.  Haga los ejercicios de un video de ejercicios.  Haga sus ejercicios favoritos con Leisure centre managerun amigo. RECOMENDACIONES PARA REALIZAR EJERCICIOS CUANDO SE TIENE DIABETES TIPO 1 O TIPO 2   Controle la glucosa en la sangre antes de comenzar. Si el nivel de glucosa en la sangre es de ms de 240 mg/dl, controle las cetonas en la [place]orina. No haga actividad fsica si hay cetonas.  Evite inyectarse insulina en las zonas del cuerpo que ejercitar. Por ejemplo, evite inyectarse insulina en:  Los brazos, si juega al tenis.  Las piernas, si corre.  Lleve un registro de:  Los alimentos que consume antes y despus de Tour managerhacer el ejercicio.  Los momentos esperables de picos de accin de la  insulina.  Los niveles de glucosa en la sangre antes y despus de hacer ejercicios.  El tipo y cantidad de [place] and the Grenadinesactividad fsica que Biomedical engineerrealiza.  Revise los registros con su mdico. El mdico lo ayudar a Environmental education officerdesarrollar pautas para ajustar la cantidad de alimento y las cantidades de insulina antes y despus de Radio producerhacer ejercicios.  Si toma insulina o agentes hipoglucemiantes por va oral, observe si hay signos y sntomas de hipoglucemia. Entre los que se incluyen:  Mareos.  Temblores.  Sudoracin.  Escalofros.  Confusin.  Beba gran cantidad de agua mientras hace ejercicios para evitar la deshidratacin o los golpes de Airline pilotcalor. Durante la actividad fsica se pierde agua corporal que se debe reponer.  Comente con su mdico antes de comenzar un programa de actividad fsica para verificar que sea seguro para usted. Recuerde, cualquier actividad es mejor que ninguna. Document Released: 06/17/2007 Document Revised: 10/12/2013 [facility] Patient Information 2015 [place], [name]. This information is not intended to replace advice given to you  by your health care provider. Make sure you discuss any questions you have with your health care provider.  

## 2014-12-23 NOTE — Progress Notes (Signed)
Patient ID: Bridget Russo, female   DOB: [birth year], 64 y.o.   MRN: [id number]   Bridget GladeDelsy Dockery, is a 64 y.o. female  [id number]  NFA:[id number]  DOB - [birth year]  Chief Complaint  Patient presents with  . Follow-up        Subjective:   Bridget GladeDelsy Trent is a 64 y.o. female here today for a follow up visit. Patient with history of type 2 diabetes mellitus without complications and osteoarthritis. Patient reports pain in her knees rated at a 7 described as burning. Patient reports her knees hurt a lot during the night. Patient reports the burning sensation started about 4 months ago. Patient does not take pain medication for it because they have not seemed to help, rather, she uses ointment and ice at night. She needs refills on metformin, pravastatin, and meloxicam. Patient has No headache, No chest pain, No abdominal pain - No Nausea, No new weakness tingling or numbness, No Cough - SOB.  Problem  Colon Cancer Screening  Breast Cancer Screening  Type 2 Diabetes Mellitus Without Complications    ALLERGIES: No Known Allergies  PAST MEDICAL HISTORY: Past Medical History  Diagnosis Date  . Arthritis   . Type 2 diabetes mellitus without complication 01/18/2014    MEDICATIONS AT HOME: Prior to Admission medications   Medication Sig Start Date End Date Taking? Authorizing Provider  meloxicam (MOBIC) 7.5 MG tablet Take 1 tablet (7.5 mg total) by mouth daily. 12/23/14  Yes Quentin Angstlugbemiga E Janeece Blok, MD  metFORMIN (GLUCOPHAGE) 500 MG tablet Take 1 tablet (500 mg total) by mouth 2 (two) times daily with a meal. 12/23/14  Yes Quentin Angstlugbemiga E Forever Arechiga, MD  pravastatin (PRAVACHOL) 20 MG tablet Take 1 tablet (20 mg total) by mouth daily. 12/23/14  Yes Quentin Angstlugbemiga E Aundre Hietala, MD  acetaminophen-codeine (TYLENOL #3) 300-30 MG per tablet Take 1 tablet by mouth every 4 (four) hours as needed. Patient not taking: Reported on 12/23/2014 08/09/14   Quentin Angstlugbemiga E Steven Basso, MD     Objective:   Filed Vitals:   12/23/14  1430 12/23/14 1431  BP:  129/80  Pulse:  65  Temp:  98 F (36.7 C)  TempSrc:  Oral  Resp:  18  Height: 5\' 2"  (1.575 m)   Weight: 201 lb (91.173 kg)   SpO2:  96%    Exam General appearance : Awake, alert, not in any distress. Speech Clear. Not toxic looking HEENT: Atraumatic and Normocephalic, pupils equally reactive to light and accomodation Neck: supple, no JVD. No cervical lymphadenopathy.  Chest:Good air entry bilaterally, no added sounds  CVS: S1 S2 regular, no murmurs.  Abdomen: Bowel sounds present, Non tender and not distended with no gaurding, rigidity or rebound. Extremities: B/L Lower Ext shows no edema, both legs are warm to touch Neurology: Awake alert, and oriented X 3, CN II-XII intact, Non focal Skin:No Rash  Data Review Lab Results  Component Value Date   HGBA1C 6.4% 12/23/2014   HGBA1C 6.0 08/09/2014   HGBA1C 6.3 05/04/2014     Assessment & Plan   1. Type 2 diabetes mellitus without complications  - Glucose (C BG) - HgB A1c is 6.4% today - metFORMIN (GLUCOPHAGE) 500 MG tablet; Take 1 tablet (500 mg total) by mouth 2 (two) times daily with a meal.  Dispense: 180 tablet; Refill: 3  Aim for 30 minutes of exercise most days. Rethink what you drink. Water is great! Aim for 2-3 Carb Choices per meal (30-45 grams) +/- 1 either way  Aim for 0-15 Carbs  per snack if hungry  Include protein in moderation with your meals and snacks  Consider reading food labels for Total Carbohydrate and Fat Grams of foods  Consider checking BG at alternate times per day  Continue taking medication as directed Be mindful about how much sugar you are adding to beverages and other foods. Try to decrease. Consider splenda. Fruit Punch - find one with no sugar  Measure and decrease portions of carbohydrate foods  Make your plate and don't go back for seconds  2. Dyslipidemia  - pravastatin (PRAVACHOL) 20 MG tablet; Take 1 tablet (20 mg total) by mouth daily.  Dispense: 90  tablet; Refill: 3 To address this please limit saturated fat to no more than 7% of your calories, limit cholesterol to 200 mg/day, increase fiber and exercise as tolerated. If needed we may add another cholesterol lowering medication to your regimen.   3. Primary osteoarthritis of both knees  - meloxicam (MOBIC) 7.5 MG tablet; Take 1 tablet (7.5 mg total) by mouth daily.  Dispense: 60 tablet; Refill: 3  4. Colon cancer screening  - Ambulatory referral to Gastroenterology  5. Breast cancer screening  - MM Digital Screening; Future  Patient have been counseled extensively about nutrition and exercise  Interpreter was used to communicate directly with patient for the entire encounter including providing detailed patient instructions.   Return in about 3 months (around 03/25/2015) for Hemoglobin A1C and Follow up, DM, Follow up HTN, Follow up Pain and comorbidities.  The patient was given clear instructions to go to ER or return to medical center if symptoms don't improve, worsen or new problems develop. The patient verbalized understanding. The patient was told to call to get lab results if they haven't heard anything in the next week.   This note has been created with Education officer, environmental. Any transcriptional errors are unintentional.    Jeanann Lewandowsky, MD, MHA, CPE, FACP, FAAP [facility] [place], [place] [phone]   12/23/2014, 3:32 PM

## 2015-01-10 ENCOUNTER — Ambulatory Visit
Admission: RE | Admit: 2015-01-10 | Discharge: 2015-01-10 | Disposition: A | Discharge status: Home / Self Care | Payer: No Typology Code available for payment source | Source: Ambulatory Visit | Attending: Internal Medicine | Admitting: Internal Medicine

## 2015-01-10 DIAGNOSIS — Z1239 Encounter for other screening for malignant neoplasm of breast: Secondary | ICD-10-CM

## 2015-01-11 ENCOUNTER — Encounter: Payer: Self-pay | Admitting: Internal Medicine

## 2015-01-19 ENCOUNTER — Telehealth: Payer: Self-pay

## 2015-01-19 NOTE — Telephone Encounter (Signed)
-----   Message from Quentin Angst, MD sent at 01/18/2015  9:08 AM EDT ----- Please inform patient that her mammogram shows no evidence of malignancy. Recommend screening mammogram in one year.

## 2015-01-19 NOTE — Telephone Encounter (Signed)
In house interpreter used  Patient is aware of her mammogram results

## 2015-02-09 ENCOUNTER — Telehealth: Payer: Self-pay | Admitting: Internal Medicine

## 2015-03-01 ENCOUNTER — Ambulatory Visit: Payer: No Typology Code available for payment source

## 2015-03-02 ENCOUNTER — Ambulatory Visit: Payer: Self-pay | Admitting: *Deleted

## 2015-03-02 VITALS — Ht 62.0 in | Wt 202.0 lb

## 2015-03-02 DIAGNOSIS — Z1211 Encounter for screening for malignant neoplasm of colon: Secondary | ICD-10-CM

## 2015-03-02 MED ORDER — BISACODYL 5 MG PO TBEC: DELAYED_RELEASE_TABLET | ORAL | Status: DC

## 2015-03-02 MED ORDER — POLYETHYLENE GLYCOL 3350 17 GM/SCOOP PO POWD: ORAL | Status: DC

## 2015-03-02 NOTE — Progress Notes (Signed)
Interpreter "Amil Amen" at patient's side during pre-visit. Interpreter and Patient denies any allergies to eggs or soy,denies any problems with anesthesia/sedation. Patient denies any oxygen use at home and does not take any diet/weight loss medications. No EMAIL.

## 2015-03-03 ENCOUNTER — Ambulatory Visit: Payer: No Typology Code available for payment source | Attending: Internal Medicine

## 2015-03-07 ENCOUNTER — Ambulatory Visit: Payer: No Typology Code available for payment source | Attending: Internal Medicine

## 2015-03-17 ENCOUNTER — Encounter: Payer: Self-pay | Admitting: Internal Medicine

## 2015-03-17 ENCOUNTER — Ambulatory Visit (AMBULATORY_SURGERY_CENTER): Payer: Self-pay | Admitting: Internal Medicine

## 2015-03-17 VITALS — BP 121/67 | HR 63 | Temp 97.7°F | Resp 18 | Ht 62.0 in | Wt 202.0 lb

## 2015-03-17 DIAGNOSIS — Z1211 Encounter for screening for malignant neoplasm of colon: Secondary | ICD-10-CM

## 2015-03-17 DIAGNOSIS — K635 Polyp of colon: Secondary | ICD-10-CM

## 2015-03-17 MED ORDER — SODIUM CHLORIDE 0.9 % IV SOLN: 500.0000 mL | INTRAVENOUS | Status: DC

## 2015-03-17 NOTE — Progress Notes (Signed)
Report to PACU, RN, vss, BBS= Clear.  

## 2015-03-17 NOTE — Patient Instructions (Addendum)
I removed one tiny polyp.  You also have a condition called diverticulosis - common and not usually a problem. Please read the handout provided.  Your next routine colonoscopy should be in 7 years - 2023  I appreciate the opportunity to care for you. Iva Boop, MD, FACG   YOU HAD AN ENDOSCOPIC PROCEDURE TODAY AT THE [facility]:   Refer to the procedure report that was given to you for any specific questions about what was found during the examination.  If the procedure report does not answer your questions, please call your gastroenterologist to clarify.  If you requested that your care partner not be given the details of your procedure findings, then the procedure report has been included in a sealed envelope for you to review at your convenience later.  YOU SHOULD EXPECT: Some feelings of bloating in the abdomen. Passage of more gas than usual.  Walking can help get rid of the air that was put into your GI tract during the procedure and reduce the bloating. If you had a lower endoscopy (such as a colonoscopy or flexible sigmoidoscopy) you may notice spotting of blood in your stool or on the toilet paper. If you underwent a bowel prep for your procedure, you may not have a normal bowel movement for a few days.  Please Note:  You might notice some irritation and congestion in your nose or some drainage.  This is from the oxygen used during your procedure.  There is no need for concern and it should clear up in a day or so.  SYMPTOMS TO REPORT IMMEDIATELY:   Following lower endoscopy (colonoscopy or flexible sigmoidoscopy):  Excessive amounts of blood in the stool  Significant tenderness or worsening of abdominal pains  Swelling of the abdomen that is new, acute  Fever of 100F or higher   For urgent or emergent issues, a gastroenterologist can be reached at any hour by calling [phone].   DIET: Your first meal following the procedure should be a small  meal and then it is ok to progress to your normal diet. Heavy or fried foods are harder to digest and may make you feel nauseous or bloated.  Likewise, meals heavy in dairy and vegetables can increase bloating.  Drink plenty of fluids but you should avoid alcoholic beverages for 24 hours. Try to increase the fiber in your diet.  ACTIVITY:  You should plan to take it easy for the rest of today and you should NOT DRIVE or use heavy machinery until tomorrow (because of the sedation medicines used during the test).    FOLLOW UP: Our staff will call the number listed on your records the next business day following your procedure to check on you and address any questions or concerns that you may have regarding the information given to you following your procedure. If we do not reach you, we will leave a message.  However, if you are feeling well and you are not experiencing any problems, there is no need to return our call.  We will assume that you have returned to your regular daily activities without incident.  If any biopsies were taken you will be contacted by phone or by letter within the next 1-3 weeks.  Please call us at [phone] if you have not heard about the biopsies in 3 weeks.    SIGNATURES/CONFIDENTIALITY: You and/or your care partner have signed paperwork which will be entered into your electronic medical record.  These  signatures attest to the fact that that the information above on your After Visit Summary has been reviewed and is understood.  Full responsibility of the confidentiality of this discharge information lies with you and/or your care-partner.  Spanish instructions were given to the patient by Dr. Leone Payor. The patient has an interpreter with her.  No questions.

## 2015-03-17 NOTE — Progress Notes (Addendum)
Called to room to assist during endoscopic procedure.  Patient ID and intended procedure confirmed with present staff. Received instructions for my participation in the procedure from the performing physician.  Ascending polyp not retrieved.

## 2015-03-17 NOTE — Op Note (Signed)
[facility] [address] [place], [address]   COLONOSCOPY PROCEDURE REPORT  PATIENT: Bridget Russo, Bridget Russo  MR#: [id number] BIRTHDATE: 06/07/1951 , 63  yrs. old GENDER: female ENDOSCOPIST: Iva Boop, MD, [facility] PROCEDURE DATE:  03/17/2015 PROCEDURE:   Colonoscopy, screening and Colonoscopy with snare polypectomy First Screening Colonoscopy - Avg.  risk and is 50 yrs.  old or older - No.  Prior Negative Screening - Now for repeat screening. 10 or more years since last screening  History of Adenoma - Now for follow-up colonoscopy & has been > or = to 3 yrs.  N/A  Polyps removed today? Yes ASA CLASS:   Class II INDICATIONS:Screening for colonic neoplasia and Colorectal Neoplasm Risk Assessment for this procedure is average risk. MEDICATIONS: Propofol 200 mg IV and Monitored anesthesia care  DESCRIPTION OF PROCEDURE:   After the risks benefits and alternatives of the procedure were thoroughly explained, informed consent was obtained.  The digital rectal exam revealed no abnormalities of the rectum.   The LB [id number]  endoscope was introduced through the anus and advanced to the cecum, which was identified by both the appendix and ileocecal valve. No adverse events experienced.   The quality of the prep was excellent. (MiraLax was used)  The instrument was then slowly withdrawn as the colon was fully examined. Estimated blood loss is zero unless otherwise noted in this procedure report.   COLON FINDINGS: A sessile polyp measuring 3 mm in size was found in the ascending colon.  A polypectomy was performed with a cold snare.  The resection was complete but the polyp tissue was not retrieved.   There was diverticulosis noted throughout the entire examined colon.   The examination was otherwise normal. Retroflexed views revealed no abnormalities. The time to cecum = 2.4 Withdrawal time = 10.2   The scope was withdrawn and the procedure completed. COMPLICATIONS:  There were no immediate complications.  ENDOSCOPIC IMPRESSION: 1.   Sessile polyp was found in the ascending colon; polypectomy was performed with a cold snare Polyp not retrieved. 2.   Diverticulosis was noted throughout the entire examined colon 3.   The examination was otherwise normal  RECOMMENDATIONS: Repeat colonoscopy 7 yrs 2023  eSigned:  Iva Boop, MD, [facility] 03/17/2015 12:20 PM   cc: The Patient and Dr. Hyman Hopes

## 2015-03-18 ENCOUNTER — Telehealth: Payer: Self-pay | Admitting: Emergency Medicine

## 2015-03-18 NOTE — Telephone Encounter (Signed)
  Follow up Call-  Call back number 03/17/2015  Post procedure Call Back phone  # [phone]  get interpreter  Permission to leave phone message No     Patient questions:  Do you have a fever, pain , or abdominal swelling? No. Pain Score  0 *  Have you tolerated food without any problems? Yes.    Have you been able to return to your normal activities? Yes.    Do you have any questions about your discharge instructions: Diet   No. Medications  No. Follow up visit  No.  Do you have questions or concerns about your Care? No.  Actions: * If pain score is 4 or above: No action needed, pain <4.

## 2015-03-22 ENCOUNTER — Telehealth: Payer: Self-pay | Admitting: Internal Medicine

## 2015-03-22 NOTE — Telephone Encounter (Signed)
Patient called requesting a med refill on the following med:  metFORMIN (GLUCOPHAGE) 500 MG tablet pravastatin (PRAVACHOL) 20 MG tablet meloxicam (MOBIC) 7.5 MG tablet   Please f/u

## 2015-04-21 ENCOUNTER — Ambulatory Visit: Payer: Self-pay | Attending: Internal Medicine | Admitting: Internal Medicine

## 2015-04-21 ENCOUNTER — Encounter: Payer: Self-pay | Admitting: Internal Medicine

## 2015-04-21 VITALS — BP 136/82 | HR 88 | Temp 99.0°F | Resp 18 | Ht 62.0 in | Wt 203.0 lb

## 2015-04-21 DIAGNOSIS — R42 Dizziness and giddiness: Secondary | ICD-10-CM

## 2015-04-21 DIAGNOSIS — M25511 Pain in right shoulder: Secondary | ICD-10-CM | POA: Insufficient documentation

## 2015-04-21 DIAGNOSIS — E119 Type 2 diabetes mellitus without complications: Secondary | ICD-10-CM | POA: Insufficient documentation

## 2015-04-21 DIAGNOSIS — E785 Hyperlipidemia, unspecified: Secondary | ICD-10-CM | POA: Insufficient documentation

## 2015-04-21 LAB — GLUCOSE, POCT (MANUAL RESULT ENTRY): POC GLUCOSE: 160 mg/dL — AB (ref 70–99)

## 2015-04-21 LAB — POCT GLYCOSYLATED HEMOGLOBIN (HGB A1C): HEMOGLOBIN A1C: 6.5

## 2015-04-21 MED ORDER — PRAVASTATIN SODIUM 20 MG PO TABS: 20.0000 mg | ORAL_TABLET | Freq: Every day | ORAL | Status: DC

## 2015-04-21 MED ORDER — MECLIZINE HCL 25 MG PO TABS: 25.0000 mg | ORAL_TABLET | Freq: Three times a day (TID) | ORAL | Status: AC | PRN

## 2015-04-21 MED ORDER — METFORMIN HCL 500 MG PO TABS: 500.0000 mg | ORAL_TABLET | Freq: Two times a day (BID) | ORAL | Status: DC

## 2015-04-21 MED ORDER — MELOXICAM 7.5 MG PO TABS: 7.5000 mg | ORAL_TABLET | Freq: Every day | ORAL | Status: DC

## 2015-04-21 MED ORDER — ACETAMINOPHEN-CODEINE #3 300-30 MG PO TABS: 1.0000 | ORAL_TABLET | ORAL | Status: DC | PRN

## 2015-04-21 NOTE — Progress Notes (Signed)
Patient ID: Bridget Russo, female   DOB: Apr 19, 1951, 64 y.o.   MRN: [id number]   Bridget Russo, is a 64 y.o. female  NFA:[id number]  [id number]  DOB - Apr 19, 1951  Chief Complaint  Patient presents with  . Follow-up        Subjective:   Bridget Russo is a 64 y.o. female with history of type 2 diabetes mellitus without complications and osteoarthritis here today for a follow up visit. Patient complains of dizziness being present for a month. Going from laying to standing causes the dizziness. She describes her dizziness as spinning around the room, no history of regarding the ears, no history of fainting attacks. Her medications are listed below. Patient also has pain in knee and right arm with burning sensation. Scaled at a 7. Pain has increased over past two months. Patient requesting refill on Tylenol 3. Patient has No headache, No chest pain, No abdominal pain - No Nausea, No new weakness tingling or numbness, No Cough - SOB.  Problem  Dizziness    ALLERGIES: No Known Allergies  PAST MEDICAL HISTORY: Past Medical History  Diagnosis Date  . Arthritis   . Type 2 diabetes mellitus without complication (HCC) 01/18/2014    MEDICATIONS AT HOME: Prior to Admission medications   Medication Sig Start Date End Date Taking? Authorizing Provider  meloxicam (MOBIC) 7.5 MG tablet Take 1 tablet (7.5 mg total) by mouth daily. 04/21/15  Yes Quentin Angstlugbemiga E Emi Lymon, MD  metFORMIN (GLUCOPHAGE) 500 MG tablet Take 1 tablet (500 mg total) by mouth 2 (two) times daily with a meal. 04/21/15  Yes Kaushik Maul E Hyman HopesJegede, MD  pravastatin (PRAVACHOL) 20 MG tablet Take 1 tablet (20 mg total) by mouth daily. 04/21/15  Yes Quentin Angstlugbemiga E Anastacia Reinecke, MD  acetaminophen-codeine (TYLENOL #3) 300-30 MG tablet Take 1 tablet by mouth every 4 (four) hours as needed. 04/21/15   Quentin Angstlugbemiga E Keshan Reha, MD  meclizine (ANTIVERT) 25 MG tablet Take 1 tablet (25 mg total) by mouth 3 (three) times daily as needed for dizziness. 04/21/15    Quentin Angstlugbemiga E Bayli Quesinberry, MD     Objective:   Filed Vitals:   04/21/15 1730  BP: 136/82  Pulse: 88  Temp: 99 F (37.2 C)  TempSrc: Oral  Resp: 18  Height: 5\' 2"  (1.575 m)  Weight: 203 lb (92.08 kg)  SpO2: 98%    Exam General appearance : Awake, alert, not in any distress. Speech Clear. Not toxic looking, morbidly obese HEENT: Atraumatic and Normocephalic, pupils equally reactive to light and accomodation Neck: supple, no JVD. No cervical lymphadenopathy.  Chest:Good air entry bilaterally, no added sounds  CVS: S1 S2 regular, no murmurs.  Abdomen: Bowel sounds present, Non tender and not distended with no gaurding, rigidity or rebound. Extremities: B/L Lower Ext shows no edema, both legs are warm to touch Neurology: Awake alert, and oriented X 3, CN II-XII intact, Non focal Skin:No Rash  Data Review Lab Results  Component Value Date   HGBA1C 6.50 04/21/2015   HGBA1C 6.4% 12/23/2014   HGBA1C 6.0 08/09/2014     Assessment & Plan   1. Type 2 diabetes mellitus without complication, without long-term current use of insulin (HCC)  - Glucose (CBG) - POCT A1C - Microalbumin/Creatinine Ratio, Urine  - metFORMIN (GLUCOPHAGE) 500 MG tablet; Take 1 tablet (500 mg total) by mouth 2 (two) times daily with a meal.  Dispense: 180 tablet; Refill: 3  Aim for 30 minutes of exercise most days. Rethink what you drink. Water is great! Aim for  2-3 Carb Choices per meal (30-45 grams) +/- 1 either way  Aim for 0-15 Carbs per snack if hungry  Include protein in moderation with your meals and snacks  Consider reading food labels for Total Carbohydrate and Fat Grams of foods  Consider checking BG at alternate times per day  Continue taking medication as directed Be mindful about how much sugar you are adding to beverages and other foods. Fruit Punch - find one with no sugar  Measure and decrease portions of carbohydrate foods  Make your plate and don't go back for seconds  2.  Dyslipidemia  - pravastatin (PRAVACHOL) 20 MG tablet; Take 1 tablet (20 mg total) by mouth daily.  Dispense: 90 tablet; Refill: 3  To address this please limit saturated fat to no more than 7% of your calories, limit cholesterol to 200 mg/day, increase fiber and exercise as tolerated. If needed we may add another cholesterol lowering medication to your regimen.   3. Dizziness  - meclizine (ANTIVERT) 25 MG tablet; Take 1 tablet (25 mg total) by mouth 3 (three) times daily as needed for dizziness.  Dispense: 30 tablet; Refill: 3  4. Right shoulder pain  - DG Shoulder Right; Future  - acetaminophen-codeine (TYLENOL #3) 300-30 MG tablet; Take 1 tablet by mouth every 4 (four) hours as needed.  Dispense: 60 tablet; Refill: 0 - meloxicam (MOBIC) 7.5 MG tablet; Take 1 tablet (7.5 mg total) by mouth daily.  Dispense: 60 tablet; Refill: 3  Patient have been counseled extensively about nutrition and exercise  Return in about 4 weeks (around 05/19/2015) for Routine Follow Up, Follow up Pain and comorbidities.  The patient was given clear instructions to go to ER or return to medical center if symptoms don't improve, worsen or new problems develop. The patient verbalized understanding. The patient was told to call to get lab results if they haven't heard anything in the next week.   This note has been created with Education officer, environmental. Any transcriptional errors are unintentional.    Jeanann Lewandowsky, MD, MHA, FACP, FAAP, CPE [facility] [place], [place] [phone]   04/21/2015, 6:03 PM

## 2015-04-21 NOTE — Progress Notes (Signed)
Patient here for F/U DM.  Patient complains of dizziness being present for a month. Going from laying to standing causes the dizziness.   Patient also has pain in knee and right arm with burning sensation. Scaled at a 7. Pain has increased over past two months.  Patient requesting refill on Tylenol 3.

## 2015-04-21 NOTE — Patient Instructions (Signed)
Diabetes y actividad fsica (Diabetes and Exercise) Hacer actividad fsica con regularidad es muy importante. No se trata solo de perder peso. Tiene muchos otros beneficios, como por ejemplo:  Mejorar el estado fsico, la flexibilidad y la resistencia.  Aumenta la densidad sea.  Ayuda a controlar el peso.  Disminuye la grasa corporal.  Aumenta la fuerza muscular.  Reduce el estrs y las tensiones.  Mejora el estado de salud general. Las personas diabticas que realizan actividad fsica tienen beneficios adicionales debido al ejercicio:  Reduce el apetito.  El organismo mejora el uso del azcar (glucosa) de la sangre.  Ayuda a disminuir o controlar la glucosa en la sangre.  Disminuye la presin arterial.  Ayuda a disminuir los lpidos en la sangre (colesterol y triglicridos).  El organismo mejora el uso de la insulina porque:  Aumenta la sensibilidad del organismo a la insulina.  Reduce las necesidades de insulina del organismo.  Disminuye el riesgo de enfermedad cardaca por la actividad fsica ya que  disminuye el colesterol y tambin los triglicridos.  Aumenta los niveles de colesterol bueno (como las lipoprotenas de alta densidad [HDL]) en el organismo.  Disminuye los niveles de glucosa en la sangre. SU PLAN DE ACTIVIDAD  Elija una actividad que disfrute y establezca objetivos realistas. Para ejercitarse sin riesgos, debe comenzar a practicar cualquier actividad fsica nueva lentamente y aumentar la intensidad del ejercicio de forma gradual con el tiempo. Su mdico o educador en diabetes podrn ayudarlo a crear un plan de actividades que lo beneficie. Las recomendaciones generales incluyen lo siguiente:  Alentar a los nios para que realicen al menos 60 minutos de actividad fsica cada da.  Estirarse y realizar ejercicios de entrenamiento de la fuerza, como yoga o levantamiento de pesas, por lo menos 2 veces por semana.  Realizar en total por lo menos 150  minutos de ejercicios de intensidad moderada cada semana, como caminar a paso ligero o hacer gimnasia acutica.  Hacer ejercicio fsico por lo menos 3 das por semana y no dejar pasar ms de 2 das seguidos sin ejercitarse.  Evitar los perodos largos de inactividad (90 minutos o ms tiempo). Cuando deba pasar mucho tiempo sentado, haga pausas frecuentes para caminar o estirarse. RECOMENDACIONES PARA REALIZAR EJERCICIOS CUANDO SE TIENE DIABETES TIPO 1 O TIPO 2   Controle la glucosa en la sangre antes de comenzar. Si el nivel de glucosa en la sangre es de ms de 240 mg/dl, controle las cetonas en la orina. No haga actividad fsica si hay cetonas.  Evite inyectarse insulina en las zonas del cuerpo que ejercitar. Por ejemplo, evite inyectarse insulina en:  Los brazos, si juega al tenis.  Las piernas, si corre.  Lleve un registro de:  Los alimentos que consume antes y despus de hacer el ejercicio.  Los momentos esperables de picos de accin de la insulina.  Los niveles de glucosa en la sangre antes y despus de hacer ejercicios.  El tipo y cantidad de actividad fsica que realiza.  Revise los registros con su mdico. El mdico lo ayudar a desarrollar pautas para ajustar la cantidad de alimento y las cantidades de insulina antes y despus de hacer ejercicios.  Si toma insulina o agentes hipoglucemiantes por va oral, observe si hay signos y sntomas de hipoglucemia. Entre los que se incluyen:  Mareos.  Temblores.  Sudoracin.  Escalofros.  Confusin.  Beba gran cantidad de agua mientras hace ejercicios para evitar la deshidratacin o los golpes de calor. Durante la actividad fsica se pierde   agua corporal que se debe reponer.  Comente con su mdico antes de comenzar un programa de actividad fsica para verificar que sea seguro para usted. Recuerde, cualquier actividad es mejor que ninguna.   Esta informacin no tiene como fin reemplazar el consejo del mdico. Asegrese de  hacerle al mdico cualquier pregunta que tenga.   Document Released: 06/17/2007 Document Revised: 10/12/2014 Elsevier Interactive Patient Education 2016 Elsevier Inc. Recuento bsico de carbohidratos para la diabetes mellitus (Basic Carbohydrate Counting for Diabetes Mellitus) El recuento de carbohidratos es un mtodo destinado a calcular la cantidad de carbohidratos en la dieta. El consumo de carbohidratos aumenta naturalmente el nivel de azcar (glucosa) en la sangre, por lo que es importante que sepa la cantidad que debe incluir en cada comida. El recuento de carbohidratos ayuda a mantener el nivel de glucosa en la sangre dentro de los lmites normales. La cantidad permitida de carbohidratos es diferente para cada persona. Un nutricionista puede ayudarlo a calcular la cantidad adecuada para usted. Una vez que sepa la cantidad de carbohidratos que puede consumir, podr calcular los carbohidratos de los alimentos que desea comer. Los siguientes alimentos incluyen carbohidratos:  Granos, como panes y cereales.  Frijoles secos y productos con soja.  Vegetales almidonados, como papas, guisantes y maz.  Frutas y jugos de frutas.  Leche y yogur.  Dulces y bocadillos, como pastel, galletas, caramelos, papas fritas de bolsa, refrescos y bebidas frutales con azcar. RECUENTO DE CARBOHIDRATOS Hay dos maneras de calcular los carbohidratos de los alimentos. Puede usar cualquiera de los dos mtodos o una combinacin de ambos. Leer la etiqueta de informacin nutricional de los alimentos envasados La informacin nutricional es una etiqueta incluida en casi todas las bebidas y los alimentos envasados de los [place]. Indica el tamao de la porcin de ese alimento o bebida e informacin sobre los nutrientes de cada porcin, incluso los gramos (g) de carbohidratos por porcin.  Decida la cantidad de porciones que comer o tomar de este alimento o bebida. Multiplique la cantidad de porciones por el  nmero de gramos de carbohidratos indicados en la etiqueta para esa porcin. El total ser la cantidad de carbohidratos que consumir al comer ese alimento o tomar esa bebida. Conocer las porciones estndar de los alimentos Cuando coma alimentos no envasados o que no incluyan la informacin nutricional en la etiqueta, deber medir las porciones para poder calcular la cantidad de carbohidratos. Una porcin de la mayora de los alimentos ricos en carbohidratos contiene alrededor de 15g de carbohidratos. La siguiente lista incluye los tamaos de porcin de los alimentos ricos en carbohidratos que contienen alrededor de 15g de carbohidratos por porcin:   1rebanada de pan (1oz) o 1tortilla de seis pulgadas.  panecillo de hamburguesa o bollito tipo ingls.  4a 6galletas.   de taza de cereal sin azcar y seco.   taza de cereal caliente.   de taza de arroz o pastas.  taza de pur de papas o de una papa grande al horno.  1taza de frutas frescas o una fruta pequea.  taza de frutas o jugo de frutas enlatados o congelados.  1 taza de leche.   de taza de yogur descremado sin ningn agregado o de yogur endulzado con edulcorante artificial.  taza de vegetales almidonados, como guisantes, maz o papas, o de frijoles secos cocidos. Decida la cantidad de porciones estndar que comer. Multiplique la cantidad de porciones por 15 (los gramos de carbohidratos en esa porcin). Por ejemplo, si come 2tazas de fresas, habr comido   2porciones y 30g de carbohidratos (2porciones x 15g = 30g). Para las comidas como sopas y guisos, en las que se mezcla ms de un alimento, deber [employer] carbohidratos de cada alimento incluido. EJEMPLO DE RECUENTO DE CARBOHIDRATOS Ejemplo de cena  3 onzas de pechugas de pollo.   de taza de arroz integral.   taza de maz.  1 taza de [place].  1 taza de fresas con crema batida sin azcar. Clculo de carbohidratos [place] 1: Identifique los  alimentos que contienen carbohidratos:   Arroz.  Maz.  Leche.  [name]. [place] 2: Calcule el nmero de porciones que consumir de cada uno:   2 porciones de [name], minerals.  1 porcin de maz.  1 porcin de leche.  1 porcin de fresas. [place] 3: Multiplique cada una de esas porciones por 15g:   2 porciones de arroz x 15 g = 30 g.  1 porcin de maz x 15 g = 15 g.  1 porcin de leche x 15 g = 15 g.  1 porcin de fresas x 15 g = 15 g. [place] 4: Sume todas las cantidades para [name] total de gramos de carbohidratos consumidos: 30 g + 15 g + 15 g + 15 g = 75 g.   Esta informacin no tiene Theme park manager el consejo del mdico. Asegrese de hacerle al mdico cualquier pregunta que tenga.   Document Released: 08/20/2011 Document Revised: 06/18/2014 Elsevier Interactive Patient Education 2016 [employer]. [place] (Dizziness) Los mareos son un problema muy frecuente. Se trata de una sensacin de inestabilidad o de desvanecimiento. Puede sentir que se va a desmayar. Un mareo puede provocarle una lesin si se tropieza o se cae. Las Dealer de todas las edades pueden sufrir Research scientist (life sciences), Biomedical engineer es ms frecuente en los adultos [place]. Esta afeccin puede tener muchas causas, entre las que se pueden [employer], la deshidratacin y [place]. INSTRUCCIONES PARA EL CUIDADO EN EL HOGAR Estas indicaciones pueden ayudarlo con el trastorno: Comida y bebida  Beba suficiente lquido para Pharmacologist la orina clara o de color amarillo plido. Esto evita la deshidratacin. Trate de beber ms lquidos transparentes, como agua.  No beba alcohol.  Limite el consumo de cafena si el mdico se lo indica.  Limite el consumo de sal si el mdico se lo indica. Actividad  Evite los movimientos rpidos.  Levntese de las sillas con lentitud y apyese hasta sentirse bien.  Por la maana, sintese primero a un lado de la cama. Cuando se sienta bien, pngase lentamente de [address] se  sostiene de algo, hasta que sepa que ha logrado el equilibrio.  Mueva las piernas con frecuencia si debe estar de pie en un lugar durante mucho tiempo. Mientras est de pie, contraiga y relaje los msculos de las piernas.  No conduzca vehculos ni opere maquinaria pesada si se siente mareado.  Evite agacharse si se siente mareado. En su casa, coloque los objetos de modo que le resulte fcil alcanzarlos sin Public librarian. Estilo de vida  No consuma ningn producto que contenga tabaco, lo que incluye cigarrillos, tabaco de Theatre manager o Administrator, Civil Service. Si necesita ayuda para dejar de fumar, consulte al [employer].  Trate de reducir el nivel de estrs practicando actividades como el yoga o la meditacin. Hable con el mdico si necesita ayuda. Instrucciones generales  Controle sus mareos para ver si hay cambios.  Tome los medicamentos solamente como se lo haya indicado el mdico. Hable con el mdico si cree que los medicamentos que est tomando  son la causa de sus mareos.  Infrmele a un amigo o a un familiar si se siente mareado. Pdale a esta persona que llame al mdico si observa cambios en su comportamiento.  Concurra a todas las visitas de control como se lo haya indicado el mdico. Esto es importante. SOLICITE ATENCIN MDICA SI:  Los American Expressmareos persisten.  Los Golden West Financialmareos o la sensacin de Production assistant, radiodesvanecimiento empeoran.  Siente nuseas.  Ha perdido la audicin.  Aparecen nuevos sntomas.  Cuando est de pie se siente inestable o que la habitacin da vueltas. SOLICITE ATENCIN MDICA DE INMEDIATO SI:  Vomita o tiene diarrea y no puede comer ni beber nada.  Tiene dificultad para hablar, para caminar, para tragar o para Boeingusar los brazos, las manos o las piernas.  Siente una debilidad generalizada.  No piensa con claridad o tiene dificultades para armar oraciones. Es posible que un amigo o un familiar adviertan que esto ocurre.  Tiene dolor de pecho, dolor abdominal, sudoracin o Chartered loss adjusterle falta el  aire.  Hay cambios en la visin.  Observa un sangrado.  Tiene dolores de Turkmenistancabeza.  Tiene dolor o rigidez en el cuello.  Tiene fiebre.   Esta informacin no tiene Theme park managercomo fin reemplazar el consejo del mdico. Asegrese de hacerle al mdico cualquier pregunta que tenga.   Document Released: 05/28/2005 Document Revised: 10/12/2014 Elsevier Interactive Patient Education [employer]2016 Elsevier Inc.

## 2015-04-22 LAB — MICROALBUMIN / CREATININE URINE RATIO
Creatinine, Urine: 74 mg/dL (ref 20–320)
Microalb Creat Ratio: 4 mcg/mg creat (ref <30)
Microalb, Ur: 0.3 mg/dL

## 2015-06-01 ENCOUNTER — Other Ambulatory Visit: Payer: Self-pay | Admitting: Internal Medicine

## 2015-07-15 ENCOUNTER — Ambulatory Visit (HOSPITAL_COMMUNITY)
Admission: RE | Admit: 2015-07-15 | Discharge: 2015-07-15 | Disposition: A | Discharge status: Home / Self Care | Payer: No Typology Code available for payment source | Source: Ambulatory Visit | Attending: Internal Medicine | Admitting: Internal Medicine

## 2015-07-15 DIAGNOSIS — M25511 Pain in right shoulder: Secondary | ICD-10-CM | POA: Insufficient documentation

## 2015-07-26 ENCOUNTER — Telehealth: Payer: Self-pay | Admitting: *Deleted

## 2015-07-26 NOTE — Telephone Encounter (Signed)
-----   Message from Quentin Angst, MD sent at 07/25/2015  3:58 PM EST ----- Please inform patient that right shoulder x-ray shows no acute abnormality.

## 2015-07-27 NOTE — Telephone Encounter (Signed)
Patient verified DOB Patient made aware of right shoulder x ray showing no acute abnormalities. Patient expressed her understanding and had no further questions at this time.

## 2015-07-28 ENCOUNTER — Encounter: Payer: Self-pay | Admitting: Internal Medicine

## 2015-07-28 ENCOUNTER — Ambulatory Visit: Payer: Self-pay | Attending: Internal Medicine | Admitting: Internal Medicine

## 2015-07-28 VITALS — BP 126/74 | HR 65 | Temp 98.2°F | Resp 18 | Ht 62.0 in | Wt 207.4 lb

## 2015-07-28 DIAGNOSIS — E119 Type 2 diabetes mellitus without complications: Secondary | ICD-10-CM | POA: Insufficient documentation

## 2015-07-28 DIAGNOSIS — G479 Sleep disorder, unspecified: Secondary | ICD-10-CM | POA: Insufficient documentation

## 2015-07-28 DIAGNOSIS — M170 Bilateral primary osteoarthritis of knee: Secondary | ICD-10-CM | POA: Insufficient documentation

## 2015-07-28 DIAGNOSIS — Z7984 Long term (current) use of oral hypoglycemic drugs: Secondary | ICD-10-CM | POA: Insufficient documentation

## 2015-07-28 DIAGNOSIS — Z124 Encounter for screening for malignant neoplasm of cervix: Secondary | ICD-10-CM

## 2015-07-28 DIAGNOSIS — R05 Cough: Secondary | ICD-10-CM | POA: Insufficient documentation

## 2015-07-28 DIAGNOSIS — K219 Gastro-esophageal reflux disease without esophagitis: Secondary | ICD-10-CM | POA: Insufficient documentation

## 2015-07-28 DIAGNOSIS — Z114 Encounter for screening for human immunodeficiency virus [HIV]: Secondary | ICD-10-CM | POA: Insufficient documentation

## 2015-07-28 DIAGNOSIS — Z79899 Other long term (current) drug therapy: Secondary | ICD-10-CM | POA: Insufficient documentation

## 2015-07-28 DIAGNOSIS — Z1272 Encounter for screening for malignant neoplasm of vagina: Secondary | ICD-10-CM | POA: Insufficient documentation

## 2015-07-28 LAB — POCT GLYCOSYLATED HEMOGLOBIN (HGB A1C): HEMOGLOBIN A1C: 6.6

## 2015-07-28 LAB — GLUCOSE, POCT (MANUAL RESULT ENTRY): POC GLUCOSE: 114 mg/dL — AB (ref 70–99)

## 2015-07-28 LAB — LIPID PANEL
Cholesterol: 203 mg/dL — ABNORMAL HIGH (ref 125–200)
HDL: 79 mg/dL (ref >=46)
LDL CALC: 79 mg/dL (ref <130)
Total CHOL/HDL Ratio: 2.6 Ratio (ref <=5.0)
Triglycerides: 224 mg/dL — ABNORMAL HIGH (ref <150)
VLDL: 45 mg/dL — ABNORMAL HIGH (ref <30)

## 2015-07-28 MED FILL — MELOXICAM 7.5 MG TABLET: 7.5 | 30 days supply | Qty: 30 | Fill #2

## 2015-07-28 MED FILL — PRAVASTATIN NA 20 MG TAB: 20 | 30 days supply | Qty: 30 | Fill #2

## 2015-07-28 MED FILL — metFORMIN HCL 500 MG TABS: 500 | 30 days supply | Qty: 60 | Fill #2

## 2015-07-28 NOTE — Progress Notes (Deleted)
Bridget Russo, is a 65 y.o. female  WNU:[id number]  IHK:[id number]  DOB - 17-Nov-1950  CC:  Chief Complaint  Patient presents with  . Follow-up    DM      HPI: Bridget Russo is a 65 y.o. female here today to for follow up visit. Patient has history of DM T2, and osteoarthritis. Patient has complaints of cough, with burning in her throat. Patient states symptoms are worse at night and when lying flat. Symptoms have been present for the last several months and are worse after eating. Patient with additional complaint of insomnia and trouble staying asleep. Patient says her husband says she snores. Patient will be referred for sleep study. Patient does not smoke or drink alcohol.  Patient has No headache, No chest pain, No abdominal pain - No Nausea, No new weakness tingling or numbness, No Cough - SOB.  No Known Allergies Past Medical History  Diagnosis Date  . Arthritis   . Type 2 diabetes mellitus without complication (HCC) 01/18/2014   Current Outpatient Prescriptions on File Prior to Visit  Medication Sig Dispense Refill  . acetaminophen-codeine (TYLENOL #3) 300-30 MG tablet Take 1 tablet by mouth every 4 (four) hours as needed. 60 tablet 0  . meloxicam (MOBIC) 7.5 MG tablet Take 1 tablet (7.5 mg total) by mouth daily. 60 tablet 3  . metFORMIN (GLUCOPHAGE) 500 MG tablet Take 1 tablet (500 mg total) by mouth 2 (two) times daily with a meal. 180 tablet 3  . pravastatin (PRAVACHOL) 20 MG tablet Take 1 tablet (20 mg total) by mouth daily. 90 tablet 3  . meclizine (ANTIVERT) 25 MG tablet Take 1 tablet (25 mg total) by mouth 3 (three) times daily as needed for dizziness. (Patient not taking: Reported on 07/28/2015) 30 tablet 3   No current facility-administered medications on file prior to visit.   Family History  Problem Relation Age of Onset  . Diabetes Mother   . Diabetes Sister   . Breast cancer Sister   . Diabetes Brother   . Breast cancer Maternal Grandmother   . Colon cancer  Neg Hx    Social History   Social History  . Marital Status: Married    Spouse Name: N/A  . Number of Children: N/A  . Years of Education: N/A   Occupational History  . Not on file.   Social History Main Topics  . Smoking status: Never Smoker   . Smokeless tobacco: Never Used  . Alcohol Use: No  . Drug Use: No  . Sexual Activity: No   Other Topics Concern  . Not on file   Social History Narrative    Review of Systems: Constitutional: Negative for fever, chills, diaphoresis, activity change, appetite change and fatigue. HENT: Negative for ear pain, nosebleeds, congestion, facial swelling, rhinorrhea, neck pain, neck stiffness and ear discharge.  Eyes: Negative for pain, discharge, redness, itching and visual disturbance. Respiratory: Negative for cough, choking, chest tightness, shortness of breath, wheezing and stridor.  Cardiovascular: Negative for chest pain, palpitations and leg swelling. Gastrointestinal: Negative for abdominal distention. Genitourinary: Negative for dysuria, urgency, frequency, hematuria, flank pain, decreased urine volume, difficulty urinating and dyspareunia.  Musculoskeletal: Negative for back pain, joint swelling, arthralgia and gait problem. Neurological: Negative for dizziness, tremors, seizures, syncope, facial asymmetry, speech difficulty, weakness, light-headedness, numbness and headaches.  Hematological: Negative for adenopathy. Does not bruise/bleed easily. Psychiatric/Behavioral: Negative for hallucinations, behavioral problems, confusion, dysphoric mood, decreased concentration and agitation.    Objective:   Filed Vitals:  07/28/15 0924  BP: 126/74  Pulse: 65  Temp: 98.2 F (36.8 C)  Resp: 18    Physical Exam: Constitutional: Patient appears well-developed and well-nourished. No distress. HENT: Normocephalic, atraumatic Eyes: Conjunctivae and EOM are normal. PERRLA, no scleral icterus. Neck: Normal ROM. Neck supple. No JVD. No  tracheal deviation. No thyromegaly. CVS: RRR, S1/S2 +, no murmurs, no gallops, no carotid bruit.  Pulmonary: Effort and breath sounds normal, no stridor, rhonchi, wheezes, rales.  Abdominal: Soft. BS +, no distension, tenderness, rebound or guarding.  Musculoskeletal: Normal range of motion. No edema and no tenderness.  Lymphadenopathy: No lymphadenopathy noted. Neuro: Alert  And Oriented X 4 Skin: Skin is warm and dry. No rash noted. Not diaphoretic. No erythema. No pallor. Psychiatric: Normal mood and affect. Behavior, judgment, thought content normal.  Lab Results  Component Value Date   WBC 13.2* 01/18/2014   HGB 13.9 01/18/2014   HCT 41.1 01/18/2014   MCV 85.3 01/18/2014   PLT 264 01/18/2014   Lab Results  Component Value Date   CREATININE 0.57 08/09/2014   BUN 13 08/09/2014   NA 138 08/09/2014   K 4.4 08/09/2014   CL 100 08/09/2014   CO2 22 08/09/2014    Lab Results  Component Value Date   HGBA1C 6.60 07/28/2015   Lipid Panel     Component Value Date/Time   CHOL 194 08/09/2014 0958   TRIG 222* 08/09/2014 0958   HDL 73 08/09/2014 0958   CHOLHDL 2.7 08/09/2014 0958   VLDL 44* 08/09/2014 0958   LDLCALC 77 08/09/2014 0958       Assessment and plan:  Bridget Russo was seen today for follow-up.  Diagnoses and all orders for this visit:  Type 2 diabetes mellitus without complication, without long-term current use of insulin (HCC) -     POCT A1C -     Glucose (CBG) -     Lipid panel  Primary osteoarthritis of both knees  Pap smear for cervical cancer screening -     Cytology - PAP -     Cervicovaginal ancillary only -     HIV antibody (with reflex)    Return in about 6 months (around 01/25/2016) for Hemoglobin A1C and Follow up, DM.  The patient was given clear instructions to go to ER or return to medical center if symptoms don't improve, worsen or new problems develop. The patient verbalized understanding. The patient was told to call to get lab results if  they haven't heard anything in the next week.     Stephanie Coup, NP-C [facility] [phone] 07/28/2015, 11:24 AM

## 2015-07-28 NOTE — Patient Instructions (Signed)
Osteoartritis (Osteoarthritis) La osteoartritis es una enfermedad que provoca dolor e inflamacin en las articulaciones. Ocurre cuando el cartlago de la articulacin afectada se desgasta. El cartlago acta como una almohadilla que cubre los extremos de los huesos que forman una articulacin. La osteoartritis es la ms frecuente de reumatismo articular. Afecta a menudo a los ancianos. Las articulaciones que se ven ms afectadas por esta afeccin son las que se encuentran en las siguientes zonas:  Los extremos de los dedos.  Los pulgares.  El cuello.  La parte inferior de la espalda.  Las rodillas.  Las caderas CAUSAS  Con el paso del [place], el cartlago que recubre los extremos de los huesos comienza a IT sales professional. Esto provoca friccin [employer], lo que causa dolor y entumecimiento en las articulaciones afectadas.  [place] probabilidades de padecer osteoartritis, incluidos los siguientes:  Edad avanzada.  Exceso de Engineer, site.  Uso excesivo de la articulacin.  Lesin previa en la articulacin. [place] y entumecimiento en la articulacin.  Con el tiempo, la articulacin pierde su forma normal.  Pueden formarse pequeos depsitos de hueso (ostefitos) en los extremos de Water engineer.  Algunos trozos de [employer] o cartlago pueden separarse y flotar dentro del espacio de la articulacin. Esto puede causar ms dolor y lesiones. DIAGNSTICO  El mdico le preguntar acerca de sus sntomas y le har un examen fsico. Le indicarn varios estudios, como:  Radiografas de Counselling psychologist.  Anlisis de sangre para descartar otros tipos de artritis. Pueden usarse pruebas adicionales para diagnosticar la enfermedad. TRATAMIENTO  Los [employer] del tratamiento son Financial controller y mejorar el funcionamiento de Water engineer. Los planes de tratamiento pueden incluir lo siguiente:  Un  programa de ejercicios recomendado que permita el descanso y el alivio de la articulacin.  Un plan de control del peso.  Tcnicas de [employer], como las siguientes:  Aplicacin correcta de fro y Freight forwarder.  Impulsos elctricos enviados a las terminaciones nerviosas que se encuentran debajo de la piel (neuroestimulacin elctrica transcutnea [TENS]).  Masajes.  Ciertos suplementos nutricionales.  Medicamentos para Financial controller como:  Paracetamol.  Antiinflamatorios no esteroides (AINE), como el naproxeno.  Narcticos o agentes de accin central, como el tramadol.  Corticoides. Estos se pueden administrar por va oral o mediante una inyeccin.  [place] para reposicionar los [employer] Services y Best boy (osteotoma) o para retirar las piezas sueltas de hueso y Database administrator. Puede ser necesario el reemplazo de las articulaciones en estadios avanzados de la enfermedad. [place] los medicamentos solamente como se lo haya indicado el mdico.  Mantenga un peso saludable. Siga las instrucciones del mdico con respecto al control del [place]. Esto puede incluir instrucciones Recruitment consultant.  Practique los ejercicios que le indiquen. Es posible que el mdico le recomiende tipos especficos de ejercicios. Estos pueden incluir los siguientes:  Ejercicios de fortalecimiento Se realizan para fortalecer los msculos que sostienen las articulaciones afectadas por la artritis. Pueden realizarse con peso o con bandas para agregar resistencia.  Actividades Precious Haws. Son Clinical research associate a paso ligero, gimnasia [place] de bajo impacto, que acelere el corazn.  Actividades de amplitud de movimientos. Dan agilidad a las articulaciones.  Ejercicios de equilibrio y [place]. Ayudan a [employer] Micro Devices se necesitan para la vida diaria.  Haga descansar a las articulaciones segn las indicaciones del mdico.  Concurra a todas las  visitas de  control como se lo haya indicado el mdico. SOLICITE ATENCIN MDICA SI:   La piel se pone roja.  Aparece una erupcin adems del dolor en la articulacin.  El dolor en la articulacin empeora.  Tiene fiebre y siente dolor en la articulacin o el msculo. SOLICITE ATENCIN MDICA DE INMEDIATO SI:  Nota una prdida importante de peso o del apetito.  Tiene transpiracin nocturna. PARA Parthenia Ames MS INFORMACIN   [employer] de Artritis y Event organiser Musculoesquelticas y Dermatolgicas [facility] of Arthritis and Musculoskeletal and Skin Diseases): www.niams.http://www.myers.net/.  Instituto [employer] el Envejecimiento ([employer] on Aging): https://walker.com/.  Instituto Norte[facility]ano de Advice worker of Rheumatology): www.rheumatology.org.   Esta informacin no tiene como fin reemplazar el consejo del mdico. Asegrese de hacerle al mdico cualquier pregunta que tenga.   Document Released: 03/07/2005 Document Revised: 06/18/2014 Elsevier Interactive Patient Education Yahoo[employer]. Diabetes y actividad fsica (Diabetes and Exercise) Hacer actividad fsica con regularidad es muy importante. No se trata solo de [employer]. Tiene muchos otros beneficios, como por ejemplo:  Mejorar el estado fsico, la flexibilidad y la resistencia.  Aumenta la densidad sea.  Ayuda a Art gallery manager.  Disminuye la Art gallery manager.  Aumenta la fuerza muscular.  Reduce el estrs y las tensiones.  Mejora el estado de salud general. Las personas diabticas que realizan actividad fsica tienen beneficios adicionales debido al ejercicio:  Reduce el apetito.  El organismo mejora el uso del azcar (glucosa) de la [place].  Ayuda a disminuir o Engineer, maintenance (IT).  Disminuye la presin arterial.  Ayuda a disminuir los lpidos en la sangre (colesterol y triglicridos).  El organismo mejora el uso de la insulina porque:  Aumenta la  sensibilidad del organismo a la insulina.  Reduce las necesidades de insulina del organismo.  Disminuye el riesgo de enfermedad cardaca por la actividad fsica ya que  disminuye el colesterol y [employer] triglicridos.  Aumenta los niveles de colesterol bueno (como las lipoprotenas de alta densidad [HDL]) en el organismo.  Disminuye los niveles de glucosa en la [place]. SU PLAN DE ACTIVIDAD  Elija una actividad que disfrute y establezca objetivos realistas. Para ejercitarse sin riesgos, debe comenzar a Education administrator cualquier actividad fsica nueva lentamente y aumentar la intensidad del ejercicio de forma gradual con el tiempo. Su mdico o educador en diabetes podrn ayudarlo a crear un plan de actividades que lo beneficie. Las recomendaciones generales incluyen lo siguiente:  Air cabin crew a los nios para que realicen al menos 60 minutos de actividad fsica Management consultant.  Estirarse y Education officer, environmental ejercicios de entrenamiento de la fuerza, como yoga o levantamiento de pesas, por lo menos 2 veces por semana.  Realizar en total por lo menos 150 minutos de ejercicios de intensidad moderada cada semana, como caminar a paso ligero o hacer gimnasia acutica.  Hacer ejercicio fsico por lo menos 3 das por semana y no dejar pasar ms de 2 das seguidos sin ejercitarse.  Evitar los perodos largos de inactividad (90 minutos o ms tiempo). Cuando deba pasar mucho tiempo sentado, haga pausas frecuentes para caminar o estirarse. RECOMENDACIONES PARA REALIZAR EJERCICIOS CUANDO SE TIENE DIABETES TIPO 1 O TIPO 2   Controle la glucosa en la sangre antes de comenzar. Si el nivel de glucosa en la sangre es de ms de 240 mg/dl, controle las cetonas en la [place]. No haga actividad fsica si hay cetonas.  Evite inyectarse insulina en las zonas del cuerpo que ejercitar. Por ejemplo, evite inyectarse  insulina en:  Los brazos, si juega al tenis.  Las piernas, si corre.  Lleve un registro de:  Los alimentos que consume  antes y despus de Tour manager.  Los momentos esperables de picos de accin de la insulina.  Los niveles de glucosa en la sangre antes y despus de hacer ejercicios.  El tipo y cantidad de [place] fsica que Biomedical engineer.  Revise los registros con su mdico. El mdico lo ayudar a Environmental education officer pautas para ajustar la cantidad de alimento y las cantidades de insulina antes y despus de Radio producer ejercicios.  Si toma insulina o agentes hipoglucemiantes por va oral, observe si hay signos y sntomas de hipoglucemia. Entre los que se incluyen:  Mareos.  Temblores.  Sudoracin.  Escalofros.  Confusin.  Beba gran cantidad de agua mientras hace ejercicios para evitar la deshidratacin o los golpes de Airline pilot. Durante la actividad fsica se pierde agua corporal que se debe reponer.  Comente con su mdico antes de comenzar un programa de actividad fsica para verificar que sea seguro para usted. Recuerde, cualquier actividad es mejor que ninguna.   Esta informacin no tiene Theme park manager el consejo del mdico. Asegrese de hacerle al mdico cualquier pregunta que tenga.   Document Released: 06/17/2007 Document Revised: 10/12/2014 Elsevier Interactive Patient Education 2016 [employer]. Recuento bsico de carbohidratos para la diabetes mellitus (Basic Carbohydrate Counting for Diabetes Mellitus) El recuento de carbohidratos es un mtodo destinado a calcular la cantidad de carbohidratos en la dieta. El consumo de carbohidratos aumenta naturalmente el nivel de azcar (glucosa) en la sangre, por lo que es importante que sepa la cantidad que debe incluir en cada comida. El recuento de carbohidratos ayuda a Futures trader de glucosa en la sangre dentro de los lmites normales. La cantidad permitida de carbohidratos es diferente para cada persona. Un nutricionista puede ayudarlo a calcular la cantidad adecuada para usted. Una vez que sepa la cantidad de carbohidratos que puede consumir,  podr calcular los carbohidratos de los alimentos que desea comer. Los siguientes alimentos incluyen carbohidratos:  Granos, como panes y cereales.  Frijoles secos y productos con soja.  Vegetales almidonados, como papas, guisantes y maz.  Nils Pyle y jugos de frutas.  Leche y Dentist.  Dulces y bocadillos, como pastel, galletas, caramelos, papas fritas de bolsa, refrescos y bebidas frutales con azcar. RECUENTO DE CARBOHIDRATOS [employer] Us de calcular los carbohidratos de los alimentos. Puede usar cualquiera de [address] o [place] combinacin de [place]. Leer la etiqueta de informacin nutricional de los alimentos envasados La informacin nutricional es una etiqueta incluida en casi todas las bebidas y los alimentos envasados de los [place]. Indica el tamao de la porcin de ese alimento o bebida e informacin sobre los nutrientes de cada porcin, incluso los gramos (g) de carbohidratos por porcin.  Decida la cantidad de porciones que comer o tomar de este alimento o bebida. Multiplique la cantidad de porciones por el nmero de gramos de carbohidratos indicados en la etiqueta para esa porcin. El total ser la cantidad de carbohidratos que consumir al comer ese alimento o tomar esa bebida. Conocer las porciones estndar de los alimentos Cuando coma alimentos no envasados o que no incluyan la informacin nutricional en la etiqueta, deber medir las porciones para poder calcular la cantidad de carbohidratos. Una porcin de la mayora de los alimentos ricos en carbohidratos contiene alrededor de 15g de carbohidratos. La siguiente [employer] tamaos de porcin de los alimentos ricos en carbohidratos que contienen  alrededor de 15g de carbohidratos por porcin:   1rebanada de pan (1oz) o 1tortilla de seis pulgadas.  panecillo de hamburguesa o bollito tipo ingls.  4a 6galletas.   de taza de cereal sin azcar y seco.   taza de cereal caliente.   de taza de arroz  o pastas.  taza de pur de papas o de una papa grande al horno.  1taza de frutas frescas o una fruta pequea.  taza de frutas o jugo de frutas enlatados o congelados.  1 taza [employer].   de taza de yogur descremado sin ningn agregado o de yogur endulzado con edulcorante artificial.  taza de vegetales almidonados, como guisantes, maz o papas, o de frijoles secos cocidos. Decida la cantidad de porciones Advertising copywriter. Multiplique la cantidad de porciones por 15 (los gramos de carbohidratos en esa porcin). Por ejemplo, si come 2tazas de fresas, habr comido 2porciones y 30g de carbohidratos (2porciones x 15g = 30g). Para las comidas como sopas y guisos, en las que se mezcla ms de un alimento, deber [employer] carbohidratos de cada alimento incluido. EJEMPLO DE RECUENTO DE CARBOHIDRATOS Ejemplo de cena  3 onzas de pechugas de pollo.   de taza de arroz integral.   taza de maz.  1 taza de [place].  1 taza de fresas con crema batida sin azcar. Clculo de carbohidratos [place] 1: Identifique los alimentos que contienen carbohidratos:   Arroz.  Maz.  Leche.  [name]. [place] 2: Calcule el nmero de porciones que consumir de cada uno:   2 porciones de [name], minerals.  1 porcin de maz.  1 porcin de leche.  1 porcin de fresas. [place] 3: Multiplique cada una de esas porciones por 15g:   2 porciones de arroz x 15 g = 30 g.  1 porcin de maz x 15 g = 15 g.  1 porcin de leche x 15 g = 15 g.  1 porcin de fresas x 15 g = 15 g. Paso 4: Sume todas las cantidades para [name] total de gramos de carbohidratos consumidos: 30 g + 15 g + 15 g + 15 g = 75 g.   Esta informacin no tiene Theme park manager el consejo del mdico. Asegrese de hacerle al mdico cualquier pregunta que tenga.   Document Released: 08/20/2011 Document Revised: 06/18/2014 Elsevier Interactive Patient Education Yahoo! Inc.

## 2015-07-28 NOTE — Progress Notes (Signed)
Bridget Russo, is a 65 y.o. female  EAV:[id number]  NWG:[id number]  DOB - 26-May-1951  CC:  Chief Complaint  Patient presents with  . Follow-up    DM       HPI: Bridget Russo is a 65 y.o. female here today to for follow up visit. Patient has history of DM T2, and osteoarthritis. Patient has complaints of cough, with burning in her throat. Patient states symptoms are worse at night and when lying flat. Symptoms have been present for the last several months and are worse after eating. Patient with additional complaint of insomnia and trouble staying asleep. Patient says her husband says she snores. Patient will be referred for sleep study. Patient does not smoke or drink alcohol.  Patient has No headache, No chest pain, No abdominal pain - No Nausea, No new weakness tingling or numbness. She is due for pap smear today.  No Known Allergies Past Medical History  Diagnosis Date  . Arthritis   . Type 2 diabetes mellitus without complication (HCC) 01/18/2014   Current Outpatient Prescriptions on File Prior to Visit  Medication Sig Dispense Refill  . acetaminophen-codeine (TYLENOL #3) 300-30 MG tablet Take 1 tablet by mouth every 4 (four) hours as needed. 60 tablet 0  . meloxicam (MOBIC) 7.5 MG tablet Take 1 tablet (7.5 mg total) by mouth daily. 60 tablet 3  . metFORMIN (GLUCOPHAGE) 500 MG tablet Take 1 tablet (500 mg total) by mouth 2 (two) times daily with a meal. 180 tablet 3  . pravastatin (PRAVACHOL) 20 MG tablet Take 1 tablet (20 mg total) by mouth daily. 90 tablet 3  . meclizine (ANTIVERT) 25 MG tablet Take 1 tablet (25 mg total) by mouth 3 (three) times daily as needed for dizziness. (Patient not taking: Reported on 07/28/2015) 30 tablet 3   No current facility-administered medications on file prior to visit.   Family History  Problem Relation Age of Onset  . Diabetes Mother   . Diabetes Sister   . Breast cancer Sister   . Diabetes Brother   . Breast cancer Maternal Grandmother     . Colon cancer Neg Hx    Social History   Social History  . Marital Status: Married    Spouse Name: N/A  . Number of Children: N/A  . Years of Education: N/A   Occupational History  . Not on file.   Social History Main Topics  . Smoking status: Never Smoker   . Smokeless tobacco: Never Used  . Alcohol Use: No  . Drug Use: No  . Sexual Activity: No   Other Topics Concern  . Not on file   Social History Narrative    Review of Systems: Constitutional: Negative for fever, chills, diaphoresis, activity change, appetite change and fatigue. HENT: Negative for ear pain, nosebleeds, congestion, facial swelling, rhinorrhea, neck pain, neck stiffness and ear discharge.  Eyes: Negative for pain, discharge, redness, itching and visual disturbance. Respiratory: Negative for cough, choking, chest tightness, shortness of breath, wheezing and stridor.  Cardiovascular: Negative for chest pain, palpitations and leg swelling. Gastrointestinal: Negative for abdominal distention. Genitourinary: Negative for dysuria, urgency, frequency, hematuria, flank pain, decreased urine volume, difficulty urinating and dyspareunia.  Musculoskeletal: Negative for back pain, joint swelling, arthralgia and gait problem. Neurological: Negative for dizziness, tremors, seizures, syncope, facial asymmetry, speech difficulty, weakness, light-headedness, numbness and headaches.  Hematological: Negative for adenopathy. Does not bruise/bleed easily. Psychiatric/Behavioral: Negative for hallucinations, behavioral problems, confusion, dysphoric mood, decreased concentration and agitation.  Objective:   Filed Vitals:   07/28/15 0924  BP: 126/74  Pulse: 65  Temp: 98.2 F (36.8 C)  Resp: 18    Physical Exam: Constitutional: Patient appears well-developed and well-nourished. No distress. HENT: Normocephalic, atraumatic, External right and left ear normal. Oropharynx is clear and moist.  Eyes: Conjunctivae and  EOM are normal. PERRLA, no scleral icterus. Neck: Normal ROM. Neck supple. No JVD. No tracheal deviation. No thyromegaly. CVS: RRR, S1/S2 +, no murmurs, no gallops, no carotid bruit.  Pulmonary: Effort and breath sounds normal, no stridor, rhonchi, wheezes, rales.  Abdominal: Soft. BS +, no distension, tenderness, rebound or guarding GU: labia, clitoris, urethral orifice & introitus - all wnl, cervix pink, uterus is anterior, midline, smooth, not enlarged  Musculoskeletal: Normal range of motion. No edema and no tenderness.  Lymphadenopathy: No lymphadenopathy noted. Neuro: Aler X 4 Oriented x 4 Skin: Skin is warm and dry. No rash noted. Not diaphoretic. No erythema. No pallor. Psychiatric: Normal mood and affect. Behavior, judgment, thought content normal.  Lab Results  Component Value Date   WBC 13.2* 01/18/2014   HGB 13.9 01/18/2014   HCT 41.1 01/18/2014   MCV 85.3 01/18/2014   PLT 264 01/18/2014   Lab Results  Component Value Date   CREATININE 0.57 08/09/2014   BUN 13 08/09/2014   NA 138 08/09/2014   K 4.4 08/09/2014   CL 100 08/09/2014   CO2 22 08/09/2014    Lab Results  Component Value Date   HGBA1C 6.60 07/28/2015   Lipid Panel     Component Value Date/Time   CHOL 194 08/09/2014 0958   TRIG 222* 08/09/2014 0958   HDL 73 08/09/2014 0958   CHOLHDL 2.7 08/09/2014 0958   VLDL 44* 08/09/2014 0958   LDLCALC 77 08/09/2014 0958       Assessment and plan:   Attie was seen today for follow-up.  Diagnoses and all orders for this visit:  Type 2 diabetes mellitus without complication, without long-term current use of insulin (HCC) -     POCT A1C -     Glucose (CBG) -     Lipid panel  Aim for 30 minutes of exercise most days. Rethink what you drink. Water is great! Aim for 2-3 Carb Choices per meal (30-45 grams) +/- 1 either way  Aim for 0-15 Carbs per snack if hungry  Include protein in moderation with your meals and snacks  Consider reading food labels for  Total Carbohydrate and Fat Grams of foods  Consider checking BG at alternate times per day  Continue taking medication as directed Be mindful about how much sugar you are adding to beverages and other foods. Fruit Punch - find one with no sugar  Measure and decrease portions of carbohydrate foods  Make your plate and don't go back for seconds   Primary osteoarthritis of both knees  Pap smear for cervical cancer screening -     Cytology - PAP -     Cervicovaginal ancillary only -     HIV antibody (with reflex)  Sleep disorder  - Referral for sleep study  Gastroesophageal reflux disease without esophagitis - Start - pantoprazole 40 mg, tablet once daily.  Return in about 6 months (around 01/25/2016) for Hemoglobin A1C and Follow up, DM.  The patient was given clear instructions to go to ER or return to medical center if symptoms don't improve, worsen or new problems develop. The patient verbalized understanding. The patient was told to call to get lab  results if they haven't heard anything in the next week.     Stephanie Coup, AGNP - [facility] [phone] 07/28/2015, 11:25 AM  Evaluation and management procedures were performed by the Advanced Practitioner under my supervision and collaboration. I have reviewed the Advanced Practitioner's note and chart, and I agree with the management and plan.   Jeanann Lewandowsky, MD, MHA, CPE, FACP, FAAP [facility] [place], [place] [phone]   07/29/2015, 9:24 AM

## 2015-07-28 NOTE — Progress Notes (Signed)
Patient is here for FU DM  Patient will also have Pap Smear completed today.  Patient denies pain at this time.

## 2015-07-29 LAB — HIV ANTIBODY (ROUTINE TESTING W REFLEX): HIV 1&2 Ab, 4th Generation: NONREACTIVE

## 2015-07-29 LAB — CYTOLOGY - PAP

## 2015-07-29 MED ORDER — PANTOPRAZOLE SODIUM 40 MG PO TBEC: 40.0000 mg | DELAYED_RELEASE_TABLET | Freq: Every day | ORAL | Status: DC

## 2015-08-01 ENCOUNTER — Telehealth: Payer: Self-pay | Admitting: *Deleted

## 2015-08-01 NOTE — Telephone Encounter (Signed)
-----   Message from Quentin Angst, MD sent at 08/01/2015 12:24 PM EST ----- Present from patient that her cholesterol panel is mostly normal. HIV is negative as of 07/28/2015. Awaiting the Pap smear results.

## 2015-08-01 NOTE — Telephone Encounter (Signed)
-----   Message from Quentin Angst, MD sent at 08/01/2015  4:22 PM EST ----- Please inform patient that her Pap smear cytology is negative for malignancy. 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 At this time all we need to do is regular follow-up with yearly Pap smear.

## 2015-08-01 NOTE — Telephone Encounter (Signed)
Medical Assistant used [employer] to contact patient.  Interpreter Name:Vicky Interpreter #: [id number]  Patient verified DOB Patient informed of cholesterol panel being mostly normal. Patient aware of HIV being negative as of 07/28/15. Patient made aware of office awaiting her Pap results and receiving a hone call once the results are received. Patient expressed her understanding and had no further questions at this time.

## 2015-08-02 NOTE — Telephone Encounter (Signed)
Medical Assistant used [employer] to contact patient.  Interpreter Name: Daine Gip #: [id number]  Patient verified DOB Patient informed of pap smear showing no evidence of malignancy. Pap smear did confirm HPV which patient has been made aware of. Patient informed of no treatment being conducted at this time. Patient will be rechecked in one year during her next exam. Patient expressed her understanding and had no further questions at this time.

## 2015-09-14 MED FILL — MELOXICAM 7.5 MG TABLET: 7.5 | 30 days supply | Qty: 30 | Fill #3

## 2015-09-14 MED FILL — PRAVASTATIN NA 20 MG TAB: 20 | 30 days supply | Qty: 30 | Fill #3

## 2015-09-14 MED FILL — metFORMIN HCL 500 MG TABS: 500 | 30 days supply | Qty: 60 | Fill #3

## 2015-09-21 ENCOUNTER — Ambulatory Visit (HOSPITAL_BASED_OUTPATIENT_CLINIC_OR_DEPARTMENT_OTHER): Payer: Self-pay | Attending: Internal Medicine | Admitting: Radiology

## 2015-09-21 VITALS — Ht 62.0 in | Wt 206.0 lb

## 2015-09-21 DIAGNOSIS — G479 Sleep disorder, unspecified: Secondary | ICD-10-CM | POA: Insufficient documentation

## 2015-09-21 DIAGNOSIS — Z79899 Other long term (current) drug therapy: Secondary | ICD-10-CM | POA: Insufficient documentation

## 2015-09-21 DIAGNOSIS — G4733 Obstructive sleep apnea (adult) (pediatric): Secondary | ICD-10-CM | POA: Insufficient documentation

## 2015-09-21 DIAGNOSIS — R0683 Snoring: Secondary | ICD-10-CM | POA: Insufficient documentation

## 2015-09-21 DIAGNOSIS — Z7984 Long term (current) use of oral hypoglycemic drugs: Secondary | ICD-10-CM | POA: Insufficient documentation

## 2015-10-01 DIAGNOSIS — G479 Sleep disorder, unspecified: Secondary | ICD-10-CM

## 2015-10-01 DIAGNOSIS — G4733 Obstructive sleep apnea (adult) (pediatric): Secondary | ICD-10-CM

## 2015-10-01 NOTE — Progress Notes (Signed)
Patient Name: Bridget Russo, Bridget Russo Date: 09/21/2015 Gender: Female D.O.B: 19-May-1951 Age (years): 16 Referring Provider: Angelica Chessman Height (inches): 62 Interpreting Physician: Baird Lyons MD, ABSM Weight (lbs): 206 RPSGT: Carolin Coy BMI: 70 MRN: [id number] Neck Size: 14.50 CLINICAL INFORMATION The patient is referred for a split night study with BPAP. MEDICATIONS Medications taken by the patient : charted for review Medications administered by patient during sleep study : METFORMIN, PRAVASTATIN  SLEEP STUDY TECHNIQUE As per the AASM Manual for the Scoring of Sleep and Associated Events v2.3 (April 2016) with a hypopnea requiring 4% desaturations. The channels recorded and monitored were frontal, central and occipital EEG, electrooculogram (EOG), submentalis EMG (chin), nasal and oral airflow, thoracic and abdominal wall motion, anterior tibialis EMG, snore microphone, electrocardiogram, and pulse oximetry. Bi-level positive airway pressure (BiPAP) was initiated when the patient met split night criteria and was titrated according to treat sleep-disordered breathing.  RESPIRATORY PARAMETERS Diagnostic Total AHI (/hr): 94.6 RDI (/hr): 100.1 OA Index (/hr): 54 CA Index (/hr): 5.2 REM AHI (/hr): 53.0 NREM AHI (/hr): 101.9 Supine AHI (/hr): 85.7 Non-supine AHI (/hr): 109.25 Min O2 Sat (%): 82.00 Mean O2 (%): 94.68 Time below 88% (min): 3.7   Titration Optimal IPAP Pressure (cm): 21 Optimal EPAP Pressure (cm): 16 AHI at Optimal Pressure (/hr): 0.0 Min O2 at Optimal Pressure (%): 96.0 Sleep % at Optimal (%): 99 Supine % at Optimal (%): 100     SLEEP ARCHITECTURE The study was initiated at 10:41:02 PM and terminated at 5:03:33 AM. The total recorded time was 382.5 minutes. EEG confirmed total sleep time was 274.5 minutes yielding a sleep efficiency of 71.8%. Sleep onset after lights out was 7.0 minutes with a REM latency of 178.0 minutes. The patient spent 12.93% of the night  in stage N1 sleep, 66.85% in stage N2 sleep, 0.36% in stage N3 and 19.85% in REM. Wake after sleep onset (WASO) was 101.1 minutes. The Arousal Index was 51.6/hour.  LEG MOVEMENT DATA The total Periodic Limb Movements of Sleep (PLMS) were 50. The PLMS index was 10.93 .  CARDIAC DATA The 2 lead EKG demonstrated sinus rhythm. The mean heart rate was 69.04 beats per minute. Other EKG findings include: None.  IMPRESSIONS - Severe obstructive sleep apnea occurred during the diagnostic portion of the study (AHI = 94.6 /hour). An optimal BiPAP pressure was selected for this patient ( 21/ 16  cm of water). Bilevel titration was required for toleration of high pressures. - No significant central sleep apnea occurred during the diagnostic portion of the study (CAI = 5.2/hour). - Mild oxygen desaturation was noted during the diagnostic portion of the study (Min O2 = 82.00%). - The patient snored with Moderate snoring volume during the diagnostic portion of the study. - No cardiac abnormalities were noted during this study. - Clinically significant periodic limb movements of sleep did not occur during the study.  DIAGNOSIS - Obstructive Sleep Apnea (327.23 [G47.33 ICD-10])  RECOMMENDATIONS - Trial of BiPAP therapy on 21/16 cm H2O with a Small size Resmed Full Face Mask AirFit F20 mask and heated humidification. - When high pressures are required, especially in a female, consider ENT evaluation for treatable mechanical airway obstruction. - Avoid alcohol, sedatives and other CNS depressants that may worsen sleep apnea and disrupt normal sleep architecture. - Sleep hygiene should be reviewed to assess factors that may improve sleep quality. - Weight management and regular exercise should be initiated or continued.  Deneise Lever Diplomate, American Board of Sleep Medicine  ELECTRONICALLY  SIGNED ON:  10/01/2015, 3:59 PM [place] PH: [phone]   FX: [phone]  [phone] [place]

## 2015-10-10 ENCOUNTER — Ambulatory Visit: Payer: No Typology Code available for payment source | Attending: Internal Medicine

## 2015-10-24 MED FILL — MELOXICAM 7.5 MG TABLET: 7.5 | 30 days supply | Qty: 30 | Fill #4

## 2015-10-24 MED FILL — PRAVASTATIN NA 20 MG TAB: 20 | 30 days supply | Qty: 30 | Fill #4

## 2015-10-24 MED FILL — ?METFORMIN HCL 500MG TABLET: 500 | 30 days supply | Qty: 60 | Fill #4

## 2015-11-28 MED FILL — MELOXICAM 7.5 MG TABLET: 7.5 | 30 days supply | Qty: 30 | Fill #5

## 2015-11-28 MED FILL — PRAVASTATIN NA 20 MG TAB: 20 | 30 days supply | Qty: 30 | Fill #5

## 2015-11-28 MED FILL — ?METFORMIN HCL 500MG TABLET: 500 | 30 days supply | Qty: 60 | Fill #5

## 2016-01-24 MED FILL — MELOXICAM 7.5 MG TABLET: 7.5 | 30 days supply | Qty: 30 | Fill #6

## 2016-01-24 MED FILL — ?METFORMIN HCL 500MG TABLET: 500 | 30 days supply | Qty: 60 | Fill #6

## 2016-01-24 MED FILL — PRAVASTATIN NA 20 MG TAB: 20 | 30 days supply | Qty: 30 | Fill #6

## 2016-02-17 ENCOUNTER — Ambulatory Visit: Payer: Self-pay | Attending: Internal Medicine

## 2016-02-21 ENCOUNTER — Other Ambulatory Visit: Payer: Self-pay | Admitting: Internal Medicine

## 2016-03-01 ENCOUNTER — Encounter: Payer: Self-pay | Admitting: Internal Medicine

## 2016-03-01 ENCOUNTER — Ambulatory Visit: Payer: Self-pay | Attending: Internal Medicine | Admitting: Internal Medicine

## 2016-03-01 VITALS — BP 134/84 | HR 74 | Temp 98.2°F | Resp 18 | Ht 62.0 in | Wt 214.0 lb

## 2016-03-01 DIAGNOSIS — E785 Hyperlipidemia, unspecified: Secondary | ICD-10-CM | POA: Insufficient documentation

## 2016-03-01 DIAGNOSIS — Z1239 Encounter for other screening for malignant neoplasm of breast: Secondary | ICD-10-CM

## 2016-03-01 DIAGNOSIS — Z7984 Long term (current) use of oral hypoglycemic drugs: Secondary | ICD-10-CM | POA: Insufficient documentation

## 2016-03-01 DIAGNOSIS — K219 Gastro-esophageal reflux disease without esophagitis: Secondary | ICD-10-CM | POA: Insufficient documentation

## 2016-03-01 DIAGNOSIS — M170 Bilateral primary osteoarthritis of knee: Secondary | ICD-10-CM | POA: Insufficient documentation

## 2016-03-01 DIAGNOSIS — E119 Type 2 diabetes mellitus without complications: Secondary | ICD-10-CM | POA: Insufficient documentation

## 2016-03-01 LAB — GLUCOSE, POCT (MANUAL RESULT ENTRY): POC Glucose: 104 mg/dl — AB (ref 70–99)

## 2016-03-01 LAB — POCT GLYCOSYLATED HEMOGLOBIN (HGB A1C): HEMOGLOBIN A1C: 7.2

## 2016-03-01 MED ORDER — PRAVASTATIN SODIUM 20 MG PO TABS: 20.0000 mg | ORAL_TABLET | Freq: Every day | ORAL | 3 refills | Status: AC

## 2016-03-01 MED ORDER — MELOXICAM 15 MG PO TABS: 15.0000 mg | ORAL_TABLET | Freq: Every day | ORAL | 3 refills | Status: DC

## 2016-03-01 MED ORDER — METFORMIN HCL 500 MG PO TABS: 500.0000 mg | ORAL_TABLET | Freq: Two times a day (BID) | ORAL | 3 refills | Status: AC

## 2016-03-01 MED ORDER — ACETAMINOPHEN-CODEINE #3 300-30 MG PO TABS: 1.0000 | ORAL_TABLET | ORAL | 0 refills | Status: AC | PRN

## 2016-03-01 MED ORDER — PANTOPRAZOLE SODIUM 40 MG PO TBEC: 40.0000 mg | DELAYED_RELEASE_TABLET | Freq: Every day | ORAL | 3 refills | Status: AC

## 2016-03-01 MED FILL — PRAVASTATIN NA 20 MG TAB: 20 | 30 days supply | Qty: 30 | Fill #0

## 2016-03-01 MED FILL — ?PANTOPRAZOLE SOD DR 40MG: 40 MG | 30 days supply | Qty: 30 | Fill #0

## 2016-03-01 MED FILL — MELOXICAM 15 MG TABLET: 15 | 30 days supply | Qty: 30 | Fill #0

## 2016-03-01 MED FILL — metFORMIN HCL 500 MG TABS: 500 | 30 days supply | Qty: 60 | Fill #0

## 2016-03-01 NOTE — Progress Notes (Signed)
Patient is here for FU DM  Patient complains of arthritic pain being present in both knees. Pain is currently scaled at a 5.  Patient has taken medication today and patient has eaten today.

## 2016-03-01 NOTE — Patient Instructions (Signed)
Diabetes y cuidados del pie (Diabetes and Foot Care) La diabetes puede ser la causa de que el flujo sanguneo (circulacin) en las piernas y los pies sea deficiente. Debido a esto, la piel de los pies se torna ms delgada, se rompe con facilidad y se cura ms lentamente. La piel puede estar seca, despellejarse y agrietarse. Tambin pueden estar daados los nervios de las piernas y de los pies lo que provoca una disminucin de la sensibilidad. Es posible que no advierta heridas ms pequeas en los pies, que pueden causar infecciones graves. Cuidar sus pies es una de las cosas ms importantes que puede hacer por usted mismo.  INSTRUCCIONES PARA EL CUIDADO EN EL HOGAR  Use siempre calzado, an dentro de su casa. No camine descalzo. Caminar descalzo facilita que se lastime.  Controle sus pies diariamente para observar ampollas, cortes y enrojecimiento. Si no puede ver la planta del pie, use un espejo o pdale ayuda a otra persona.  Lave sus pies con agua tibia (no use agua caliente) y un jabn suave. Seque bien sus pies, y la zona entre los dedos dando palmaditas, hasta que estn completamente secos. Noremoje los pies, ya que esto puede resecar la piel.  Aplique una locin hidratante o vaselina (que no contenga alcohol ni perfume) en los pies y en las uas secas y quebradizas. No aplique locin entre los dedos.  Recorte las uas en forma recta. No escarbe debajo de las uas o alrededor de las cutculas. Lime los bordes de las uas con una lima o esmeril.  No corte las durezas o callosidades, ni trate de quitarlas con medicamentos.  Use calcetines de algodn o medias limpias todos los das. Asegrese de que no le ajusten demasiado. Nouse calcetines que le lleguen a las rodillas, ya que podran disminuir el flujo de sangre a las piernas.  Use zapatos de cuero que le queden bien y que sean acolchados. Para amoldar los zapatos, clcelos slo algunas horas por da. Esto evitar lesiones en los pies. Revise  siempre los zapatos antes de ponerlos para asegurarse de que no haya objetos en su interior.  No cruce las piernas. Esto puede disminuir el flujo de sangre a los pies.  Si algo le ha raspado, cortado o lastimado la piel de los pies, mantenga la piel de esa zona limpia y seca. Debe higienizar estas zonas con agua y un jabn suave. No limpie la zona con agua oxigenada, alcohol ni yodo.  Cuando se quite un vendaje adhesivo, asegrese de no daar la piel.  Si tiene una herida, obsrvela varias veces por da para asegurarse de que se est curando.  No use bolsas de agua caliente ni almohadillas trmicas. Podran causar quemaduras. Si ha perdido la sensibilidad en los pies o las piernas, no sabr lo que le est sucediendo hasta que sea demasiado tarde.  Asegrese de que su mdico le haga un examen completo de los pies por lo menos una vez al ao, o con ms frecuencia si usted tiene problemas en los pies. Informe todos los cortes, llagas o moretones a su mdico inmediatamente. SOLICITE ATENCIN MDICA SI:   Tiene una lesin que no se cura.  Tiene cortes o rajaduras en la piel.  Tiene una ua encarnada.  Nota una zona irritada en las piernas o los pies.  Siente una sensacin de ardor u hormigueo en las piernas o los pies.  Siente dolor o calambres en las piernas o los pies.  Las piernas o los pies estn adormecidos.    Siente los pies siempre fros. SOLICITE ATENCIN MDICA DE INMEDIATO SI:   Presenta enrojecimiento, hinchazn o aumento del dolor en una herida.  Nota una lnea roja que sube por pierna.  Aparece pus en la herida.  Le sube la fiebre o segn lo que le indique el mdico.  Advierte un olor ftido que proviene de una lcera o una herida.   Esta informacin no tiene Theme park managercomo fin reemplazar el consejo del mdico. Asegrese de hacerle al mdico cualquier pregunta que tenga.   Document Released: 05/28/2005 Document Revised: 01/28/2013 Elsevier Interactive Patient Education 2016  [employer] Recuento bsico de carbohidratos para la diabetes mellitus (Basic Carbohydrate Counting for Diabetes Mellitus) El recuento de carbohidratos es un mtodo destinado a calcular la cantidad de carbohidratos en la dieta. El consumo de carbohidratos aumenta naturalmente el nivel de azcar (glucosa) en la sangre, por lo que es importante que sepa la cantidad que debe incluir en cada comida. El recuento de carbohidratos ayuda a Futures tradermantener el nivel de glucosa en la sangre dentro de los lmites normales. La cantidad permitida de carbohidratos es diferente para cada persona. Un nutricionista puede ayudarlo a calcular la cantidad adecuada para usted. Una vez que sepa la cantidad de carbohidratos que puede consumir, podr calcular los carbohidratos de los alimentos que desea comer. Los siguientes alimentos incluyen carbohidratos:  Granos, como panes y cereales.  Frijoles secos y productos con soja.  Vegetales almidonados, como papas, guisantes y maz.  Nils PyleFrutas y jugos de frutas.  Leche y Dentistyogur.  Dulces y bocadillos, como pastel, galletas, caramelos, papas fritas de bolsa, refrescos y bebidas frutales con azcar. RECUENTO DE CARBOHIDRATOS Toys ''R'' UsHay dos maneras de calcular los carbohidratos de los alimentos. Puede usar cualquiera de 1 Kamani Stlos dos mtodos o [place] combinacin de [place]ambos. Leer la etiqueta de informacin nutricional de los alimentos envasados La informacin nutricional es una etiqueta incluida en casi todas las bebidas y los alimentos envasados de los [place]. Indica el tamao de la porcin de ese alimento o bebida e informacin sobre los nutrientes de cada porcin, incluso los gramos (g) de carbohidratos por porcin.  Decida la cantidad de porciones que comer o tomar de este alimento o bebida. Multiplique la cantidad de porciones por el nmero de gramos de carbohidratos indicados en la etiqueta para esa porcin. El total ser la cantidad de carbohidratos que consumir al comer ese alimento o  tomar esa bebida. Conocer las porciones estndar de los alimentos Cuando coma alimentos no envasados o que no incluyan la informacin nutricional en la etiqueta, deber medir las porciones para poder calcular la cantidad de carbohidratos. Una porcin de la mayora de los alimentos ricos en carbohidratos contiene alrededor de 15g de carbohidratos. La siguiente World Fuel Services Corporationlista incluye los tamaos de porcin de los alimentos ricos en carbohidratos que contienen alrededor de 15g de carbohidratos por porcin:   1rebanada de pan (1oz) o 1tortilla de seis pulgadas.  panecillo de hamburguesa o bollito tipo ingls.  4a 6galletas.   de taza de cereal sin azcar y seco.   taza de cereal caliente.   de taza de arroz o pastas.  taza de pur de papas o de una papa grande al horno.  1taza de frutas frescas o una fruta pequea.  taza de frutas o jugo de frutas enlatados o congelados.  1 taza AutoZonede leche.   de taza de yogur descremado sin ningn agregado o de yogur endulzado con edulcorante artificial.  taza de vegetales almidonados, como guisantes, maz o papas, o de frijoles secos cocidos.  Decida la cantidad de porciones Advertising copywriter. Multiplique la cantidad de porciones por 15 (los gramos de carbohidratos en esa porcin). Por ejemplo, si come 2tazas de fresas, habr comido 2porciones y 30g de carbohidratos (2porciones x 15g = 30g). Para las comidas como sopas y guisos, en las que se mezcla ms de un alimento, deber [employer] carbohidratos de cada alimento incluido. EJEMPLO DE RECUENTO DE CARBOHIDRATOS Ejemplo de cena  3 onzas de pechugas de pollo.   de taza de arroz integral.   taza de maz.  1 taza de [place].  1 taza de fresas con crema batida sin azcar. Clculo de carbohidratos [place] 1: Identifique los alimentos que contienen carbohidratos:   Arroz.  Maz.  Leche.  [name]. [place] 2: Calcule el nmero de porciones que consumir de cada uno:   2 porciones de  [name], minerals.  1 porcin de maz.  1 porcin de leche.  1 porcin de fresas. [place] 3: Multiplique cada una de esas porciones por 15g:   2 porciones de arroz x 15 g = 30 g.  1 porcin de maz x 15 g = 15 g.  1 porcin de leche x 15 g = 15 g.  1 porcin de fresas x 15 g = 15 g. [place] 4: Sume todas las cantidades para [name] total de gramos de carbohidratos consumidos: 30 g + 15 g + 15 g + 15 g = 75 g.   Esta informacin no tiene Theme park manager el consejo del mdico. Asegrese de hacerle al mdico cualquier pregunta que tenga.   Document Released: 08/20/2011 Document Revised: 06/18/2014 Elsevier Interactive Patient Education 2016 [employer]. Osteoartritis (Osteoarthritis) La osteoartritis es una enfermedad que provoca dolor e inflamacin en las articulaciones. Ocurre cuando el cartlago de la articulacin afectada se desgasta. El cartlago acta como una almohadilla que cubre los extremos de los huesos que forman una articulacin. La osteoartritis es la ms frecuente de reumatismo articular. Afecta a menudo a los ancianos. Las articulaciones que se ven ms afectadas por esta afeccin son las que se encuentran en las siguientes zonas:  Los extremos de los dedos.  Los pulgares.  El cuello.  La parte inferior de la espalda.  Las rodillas.  Las caderas CAUSAS  Con el paso del [place], el cartlago que recubre los extremos de los huesos comienza a [name]. Esto provoca friccin [name], lo que causa dolor y entumecimiento en las articulaciones afectadas.  FACTORES DE RIESGO Ciertos factores pueden aumentar las probabilidades de padecer osteoartritis, incluidos los siguientes:  Edad avanzada.  Exceso de [name], broadcasting/film/video.  Uso excesivo de la articulacin.  Lesin previa en la articulacin. SIGNOS Y SNTOMAS   Dolor, hinchazn y entumecimiento en la articulacin.  Con el tiempo, la articulacin pierde su forma normal.  Pueden formarse pequeos depsitos de  hueso (ostefitos) en los extremos de Nurse, learning disability.  Algunos trozos de [name] o cartlago pueden separarse y flotar dentro del espacio de la articulacin. Esto puede causar ms dolor y lesiones. DIAGNSTICO  El mdico le preguntar acerca de sus sntomas y le har un examen fsico. Le indicarn varios estudios, como:  Radiografas de Statistician.  Anlisis de sangre para descartar otros tipos de artritis. Pueden usarse pruebas adicionales para diagnosticar la enfermedad. TRATAMIENTO  Los PepsiCo del tratamiento son Human resources officer y mejorar el funcionamiento de Nurse, learning disability. Los planes de tratamiento pueden incluir lo siguiente:  Un programa de ejercicios recomendado que permita el descanso y el alivio de la  articulacin.  Un plan de control del peso.  Tcnicas de Occidental Petroleum, como las siguientes:  Aplicacin correcta de fro y Airline pilot.  Impulsos elctricos enviados a las terminaciones nerviosas que se encuentran debajo de la piel (neuroestimulacin elctrica transcutnea [TENS]).  Masajes.  Ciertos suplementos nutricionales.  Medicamentos para Human resources officer como:  Paracetamol.  Antiinflamatorios no esteroides (AINE), como el naproxeno.  Narcticos o agentes de accin central, como el tramadol.  Corticoides. Estos se pueden administrar por va oral o mediante una inyeccin.  Ciruga para reposicionar los TransMontaigne y Engineer, materials (osteotoma) o para retirar las piezas sueltas de hueso y TEFL teacher. Puede ser necesario el reemplazo de las articulaciones en estadios avanzados de la enfermedad. INSTRUCCIONES PARA EL CUIDADO EN EL HOGAR   Tome los medicamentos solamente como se lo haya indicado el mdico.  Mantenga un peso saludable. Siga las instrucciones del mdico con respecto al control del [place]. Esto puede incluir instrucciones Software engineer.  Practique los ejercicios que le indiquen. Es posible que el mdico le recomiende tipos especficos  de ejercicios. Estos pueden incluir los siguientes:  Ejercicios de fortalecimiento Se realizan para fortalecer los msculos que sostienen las articulaciones afectadas por la artritis. Pueden realizarse con peso o con bandas para agregar resistencia.  Actividades Rhona Raider. Son Programmer, applications a paso ligero, gimnasia [place] de bajo impacto, que acelere el corazn.  Actividades de amplitud de movimientos. Dan agilidad a las articulaciones.  Ejercicios de equilibrio y [place]. Ayudan a McKesson se necesitan para la vida diaria.  Haga descansar a las articulaciones segn las indicaciones del mdico.  Concurra a todas las visitas de control como se lo haya indicado el mdico. SOLICITE ATENCIN MDICA SI:   La piel se pone roja.  Aparece una erupcin adems del dolor en la articulacin.  El dolor en la articulacin empeora.  Tiene fiebre y siente dolor en la articulacin o el msculo. SOLICITE ATENCIN MDICA DE INMEDIATO SI:  Nota una prdida importante de peso o del apetito.  Tiene transpiracin nocturna. PARA Parthenia Ames MS INFORMACIN   The [employer] de Artritis y Event organiser Musculoesquelticas y Dermatolgicas [facility] of Arthritis and Musculoskeletal and Skin Diseases): www.niams.http://www.myers.net/.  Instituto [employer] el Envejecimiento ([employer] on Aging): https://walker.com/.  Instituto Norteamericano de Advice worker of Rheumatology): www.rheumatology.org.   Esta informacin no tiene como fin reemplazar el consejo del mdico. Asegrese de hacerle al mdico cualquier pregunta que tenga.   Document Released: 03/07/2005 Document Revised: 06/18/2014 Elsevier Interactive Patient Education [employer].

## 2016-03-01 NOTE — Progress Notes (Signed)
Bridget GladeDelsy Omeara, is a 65 y.o. female  ZOX:[id number]  [id number]  DOB - Oct 19, 1950  Chief Complaint  Patient presents with  . Diabetes      Subjective:   Bridget Russo is a 65 y.o. female with history of type 2 diabetes, dyslipidemia, GERD and osteoarthritis here today for a follow up visit and medication refill. Major complaint today is ongoing pain in her knees from osteoarthritis. No fever, no joint swelling, no redness, no urinary symptom, no change in bowel habit. She is due for mammogram. Patient has No headache, No chest pain, No abdominal pain - No Nausea, No new weakness tingling or numbness, No Cough - SOB.  Problem  Gastroesophageal Reflux Disease Without Esophagitis    ALLERGIES: No Known Allergies  PAST MEDICAL HISTORY: Past Medical History:  Diagnosis Date  . Arthritis   . Type 2 diabetes mellitus without complication (HCC) 01/18/2014    MEDICATIONS AT HOME: Prior to Admission medications   Medication Sig Start Date End Date Taking? Authorizing Provider  acetaminophen-codeine (TYLENOL #3) 300-30 MG tablet Take 1 tablet by mouth every 4 (four) hours as needed. 03/01/16  Yes Quentin Angstlugbemiga E Delno Blaisdell, MD  meloxicam (MOBIC) 15 MG tablet Take 1 tablet (15 mg total) by mouth daily. 03/01/16  Yes Quentin Angstlugbemiga E Glennette Galster, MD  metFORMIN (GLUCOPHAGE) 500 MG tablet Take 1 tablet (500 mg total) by mouth 2 (two) times daily with a meal. 03/01/16  Yes Younis Mathey E Hyman HopesJegede, MD  pantoprazole (PROTONIX) 40 MG tablet Take 1 tablet (40 mg total) by mouth daily. 03/01/16  Yes Quentin Angstlugbemiga E Levonte Molina, MD  pravastatin (PRAVACHOL) 20 MG tablet Take 1 tablet (20 mg total) by mouth daily. 03/01/16  Yes Quentin Angstlugbemiga E Mylinh Cragg, MD  meclizine (ANTIVERT) 25 MG tablet Take 1 tablet (25 mg total) by mouth 3 (three) times daily as needed for dizziness. Patient not taking: Reported on 03/01/2016 04/21/15   Quentin Angstlugbemiga E Elliott Lasecki, MD   Objective:   Vitals:   03/01/16 1102  BP: 134/84  Pulse: 74  Resp: 18  Temp:  98.2 F (36.8 C)  TempSrc: Oral  SpO2: 96%  Weight: 214 lb (97.1 kg)  Height: 5\' 2"  (1.575 m)    Exam General appearance : Awake, alert, not in any distress. Speech Clear. Not toxic looking, obese HEENT: Atraumatic and Normocephalic, pupils equally reactive to light and accomodation Neck: Supple, no JVD. No cervical lymphadenopathy.  Chest: Good air entry bilaterally, no added sounds  CVS: S1 S2 regular, no murmurs.  Abdomen: Bowel sounds present, Non tender and not distended with no gaurding, rigidity or rebound. Extremities: B/L Lower Ext shows no edema, both legs are warm to touch Neurology: Awake alert, and oriented X 3, CN II-XII intact, Non focal Skin: No Rash  Data Review Lab Results  Component Value Date   HGBA1C 7.2 03/01/2016   HGBA1C 6.60 07/28/2015   HGBA1C 6.50 04/21/2015   Assessment & Plan   1. Type 2 diabetes mellitus without complication, without long-term current use of insulin (HCC)  - POCT A1C - Glucose (CBG) Refill - metFORMIN (GLUCOPHAGE) 500 MG tablet; Take 1 tablet (500 mg total) by mouth 2 (two) times daily with a meal.  Dispense: 180 tablet; Refill: 3  Aim for 30 minutes of exercise most days. Rethink what you drink. Water is great! Aim for 2-3 Carb Choices per meal (30-45 grams) +/- 1 either way  Aim for 0-15 Carbs per snack if hungry  Include protein in moderation with your meals and snacks  Consider  reading food labels for Total Carbohydrate and Fat Grams of foods  Consider checking BG at alternate times per day  Continue taking medication as directed Be mindful about how much sugar you are adding to beverages and other foods. Fruit Punch - find one with no sugar  Measure and decrease portions of carbohydrate foods  Make your plate and don't go back for seconds  2. Primary osteoarthritis of both knees Start - acetaminophen-codeine (TYLENOL #3) 300-30 MG tablet; Take 1 tablet by mouth every 4 (four) hours as needed.  Dispense: 60 tablet;  Refill: 0 Increase meloxicam to 15 mg daily - meloxicam (MOBIC) 15 MG tablet; Take 1 tablet (15 mg total) by mouth daily.  Dispense: 30 tablet; Refill: 3  3. Breast cancer screening  - MM DIGITAL SCREENING BILATERAL; Future  4. Dyslipidemia  Refill - pravastatin (PRAVACHOL) 20 MG tablet; Take 1 tablet (20 mg total) by mouth daily.  Dispense: 90 tablet; Refill: 3  To address this please limit saturated fat to no more than 7% of your calories, limit cholesterol to 200 mg/day, increase fiber and exercise as tolerated. If needed we may add another cholesterol lowering medication to your regimen.   5. Gastroesophageal reflux disease without esophagitis  Refill - pantoprazole (PROTONIX) 40 MG tablet; Take 1 tablet (40 mg total) by mouth daily.  Dispense: 90 tablet; Refill: 3  Patient have been counseled extensively about nutrition and exercise  Return in about 3 months (around 05/31/2016) for Follow up HTN, Hemoglobin A1C and Follow up, DM, Follow up Pain and comorbidities.  The patient was given clear instructions to go to ER or return to medical center if symptoms don't improve, worsen or new problems develop. The patient verbalized understanding. The patient was told to call to get lab results if they haven't heard anything in the next week.   This note has been created with Education officer, environmental. Any transcriptional errors are unintentional.    Jeanann Lewandowsky, MD, MHA, Maxwell Caul, CPE [facility] and [facility] [place], [place] [phone]   03/01/2016, 11:37 AM

## 2016-03-06 ENCOUNTER — Encounter (HOSPITAL_COMMUNITY): Payer: Self-pay

## 2016-03-06 ENCOUNTER — Emergency Department (HOSPITAL_COMMUNITY)
Admission: EM | Admit: 2016-03-06 | Discharge: 2016-03-06 | Disposition: A | Discharge status: Home / Self Care | Payer: Self-pay | Attending: Emergency Medicine | Admitting: Emergency Medicine

## 2016-03-06 ENCOUNTER — Ambulatory Visit (INDEPENDENT_AMBULATORY_CARE_PROVIDER_SITE_OTHER): Payer: Self-pay | Admitting: Physician Assistant

## 2016-03-06 VITALS — BP 160/88 | HR 69 | Temp 98.2°F | Resp 17 | Ht 59.5 in | Wt 216.0 lb

## 2016-03-06 DIAGNOSIS — Z7984 Long term (current) use of oral hypoglycemic drugs: Secondary | ICD-10-CM | POA: Insufficient documentation

## 2016-03-06 DIAGNOSIS — R070 Pain in throat: Secondary | ICD-10-CM | POA: Insufficient documentation

## 2016-03-06 DIAGNOSIS — J392 Other diseases of pharynx: Secondary | ICD-10-CM

## 2016-03-06 DIAGNOSIS — R0989 Other specified symptoms and signs involving the circulatory and respiratory systems: Secondary | ICD-10-CM | POA: Insufficient documentation

## 2016-03-06 DIAGNOSIS — E119 Type 2 diabetes mellitus without complications: Secondary | ICD-10-CM | POA: Insufficient documentation

## 2016-03-06 NOTE — Progress Notes (Signed)
   03/06/2016 3:56 PM   DOB: 04/09/1951 / MRN: [id number]  SUBJECTIVE:  Bridget Russo is a 65 y.o. female presenting for something being stuck in her throat. She reports this has been present for about 5 days now and reports this is a fish bone. She has tried eating bananas and marshmallows along with oil and this has not worked.  She denies pain at this time.  She is able to eat and drink at this time. She reports that she can feel the object with her finger.    She has No Known Allergies.   She  has a past medical history of Arthritis and Diabetes (HCC) (2012).    She  reports that she has never smoked. She has never used smokeless tobacco. She reports that she does not drink alcohol or use drugs. She  reports that she currently engages in sexual activity and has had female partners. The patient  has no past surgical history on file.  Her family history includes Cancer in her sister; Diabetes in her brother, mother, and sister; Heart disease in her brother; Hypercholesterolemia in her sister; Hypertension in her mother.  Review of Systems  Constitutional: Negative for chills and fever.  Skin: Negative for itching and rash.  Neurological: Negative for dizziness.    The problem list and medications were reviewed and updated by myself where necessary and exist elsewhere in the encounter.   OBJECTIVE:  BP (!) 160/88 (BP Location: Right Arm, Patient Position: Sitting, Cuff Size: Large)   Pulse 69   Temp 98.2 F (36.8 C) (Oral)   Resp 17   Ht 4' 11.5" (1.511 m)   Wt 216 lb (98 kg)   SpO2 96%   BMI 42.90 kg/m   Physical Exam  Constitutional: She is oriented to person, place, and time.  HENT:  Mouth/Throat: Uvula is midline, oropharynx is clear and moist and mucous membranes are normal.    Cardiovascular: Normal rate, regular rhythm and normal heart sounds.   Pulmonary/Chest: Effort normal and breath sounds normal.  Abdominal: Soft. Bowel sounds are normal.  Musculoskeletal: Normal  range of motion.  Neurological: She is alert and oriented to person, place, and time.  Skin: Skin is warm and dry.  Psychiatric: She has a normal mood and affect.    No results found for this or any previous visit (from the past 72 hour(s)).  No results found.  ASSESSMENT AND PLAN  [name] was seen today for feels like object in throat.  Diagnoses and all orders for this visit:  Throat irritation: I can not appreciate any object in her throat at this time.  She says she can feel the object with her finger.  Advised that I will get her into ENT as quickly as possible for a potential scope and removal of the object.     The patient is advised to call or return to clinic if she does not see an improvement in symptoms, or to seek the care of the closest emergency department if she worsens with the above plan.   Deliah BostonMichael Leslee Haueter, MHS, PA-C [facility] 03/06/2016 3:56 PM

## 2016-03-06 NOTE — ED Provider Notes (Signed)
[facility]-EMERGENCY DEPT Provider Note   CSN: [id number] Arrival date & time: 03/06/16  1731  By signing my name below, I, Bridget Russo, attest that this documentation has been prepared under the direction and in the presence of Kerrie Buffalo, NP. Electronically Signed: Sandrea Russo, ED Scribe. 03/06/16. 6:41 PM.    History   Chief Complaint Chief Complaint  Patient presents with  . Swallowed Foreign Body    HPI Comments: Bridget Russo is a 65 y.o. female who presents to the Emergency Department complaining of difficulty swallowing and sore throat since swallowing a foreign body 1 week ago. Pt's companion translates for the pt and provider. Pt says the foreign body has been bothering her constantly but is not painful constantly. She says she experiences pain when she swallows her spit but not when she swallows food. Pt says that nothing alleviates her symptoms. She denies change in eating habits. Pt says she has PMHx of DM and arthritis. She says she takes metformin. Pt states no other symptoms or complaints.   The history is provided by the patient and a friend. A language interpreter was used (Pt companion interpreted for pt and provider).  Swallowed Foreign Body  This is a new problem. The current episode started more than 2 days ago. The problem occurs constantly. The problem has not changed since onset.The symptoms are aggravated by swallowing. Nothing relieves the symptoms.    Past Medical History:  Diagnosis Date  . Arthritis   . Diabetes (HCC) 2012  . Type 2 diabetes mellitus without complication (HCC) 01/18/2014    Patient Active Problem List   Diagnosis Date Noted  . Gastroesophageal reflux disease without esophagitis 03/01/2016  . Pap smear for cervical cancer screening 07/28/2015  . Dizziness 04/21/2015  . Right shoulder pain 04/21/2015  . Colon cancer screening 12/23/2014  . Breast cancer screening 12/23/2014  . Type 2 diabetes mellitus without complications (HCC)  12/23/2014  . Primary osteoarthritis of both knees 05/04/2014  . Type 2 diabetes mellitus without complication (HCC) 01/18/2014  . Dyslipidemia 01/18/2014  . Knee pain, bilateral 06/09/2012  . LLQ abdominal pain 06/09/2012  . Preventative health care 06/09/2012    Past Surgical History:  Procedure Laterality Date  . BREAST BIOPSY  2005   Negative  . COLONOSCOPY  2003   in [place] 1 polyp removed per pt.    OB History    Gravida Para Term Preterm AB Living   4 3 3  0 1     SAB TAB Ectopic Multiple Live Births   1 0 0           Home Medications    Prior to Admission medications   Medication Sig Start Date End Date Taking? Authorizing Provider  acetaminophen-codeine (TYLENOL #3) 300-30 MG tablet Take 1 tablet by mouth every 4 (four) hours as needed. 03/01/16   Quentin Angst, MD  meclizine (ANTIVERT) 25 MG tablet Take 1 tablet (25 mg total) by mouth 3 (three) times daily as needed for dizziness. Patient not taking: Reported on 03/01/2016 04/21/15   Quentin Angst, MD  meloxicam (MOBIC) 15 MG tablet Take 1 tablet (15 mg total) by mouth daily. 03/01/16   Quentin Angst, MD  meloxicam (MOBIC) 15 MG tablet Take 15 mg by mouth daily.    Historical Provider, MD  metFORMIN (GLUCOPHAGE) 500 MG tablet Take 1 tablet (500 mg total) by mouth 2 (two) times daily with a meal. 03/01/16   Quentin Angst, MD  metFORMIN (GLUCOPHAGE)  500 MG tablet Take by mouth 2 (two) times daily with a meal.    Historical Provider, MD  pantoprazole (PROTONIX) 40 MG tablet Take 1 tablet (40 mg total) by mouth daily. 03/01/16   Quentin Angst, MD  pantoprazole (PROTONIX) 40 MG tablet Take 40 mg by mouth daily.    Historical Provider, MD  pravastatin (PRAVACHOL) 20 MG tablet Take 1 tablet (20 mg total) by mouth daily. 03/01/16   Quentin Angst, MD  pravastatin (PRAVACHOL) 20 MG tablet Take 20 mg by mouth daily.    Historical Provider, MD    Family History Family History  Problem  Relation Age of Onset  . Diabetes Sister   . Breast cancer Sister   . Diabetes Mother   . Diabetes Brother   . Breast cancer Maternal Grandmother   . Colon cancer Neg Hx   . Hypertension Mother   . Cancer Sister   . Diabetes Brother   . Heart disease Brother   . Diabetes Sister   . Hypercholesterolemia Sister     Social History Social History  Substance Use Topics  . Smoking status: Never Smoker  . Smokeless tobacco: Never Used  . Alcohol use No     Allergies   Review of patient's allergies indicates no known allergies.   Review of Systems Review of Systems  Constitutional: Negative for appetite change.  HENT: Positive for sore throat.   all other systems negative   Physical Exam Updated Vital Signs BP 195/89 (BP Location: Right Arm)   Pulse 66   Temp 98.2 F (36.8 C) (Oral)   Resp 18   SpO2 98%   Physical Exam  Constitutional: She appears well-developed and well-nourished. No distress.  HENT:  Head: Normocephalic.  Mouth/Throat: Mucous membranes are normal.  No erythema or abrasion noted to posterior pharynx, no F/B seen. Patient has no difficulty swallowing.   Eyes: Conjunctivae are normal.  Cardiovascular: Normal rate and regular rhythm.   No murmur heard. Pulmonary/Chest: Effort normal and breath sounds normal. No respiratory distress.  Abdominal: Soft.  Musculoskeletal: She exhibits no edema.  Neurological: She is alert.  Skin: Skin is warm and dry.  Psychiatric: She has a normal mood and affect. Her behavior is normal.  Nursing note and vitals reviewed.    ED Treatments / Results   DIAGNOSTIC STUDIES: Oxygen Saturation is 96% on RA, adequate by my interpretation.    COORDINATION OF CARE: 6:02 PM Discussed treatment plan with pt at bedside and pt agreed to plan.  7:39 PM Discussed pt with GI, decision was made to refer to ENT. Spoke with Dr. Ranae Palms who decided that since pt takes food and fluid without difficulty, we will not need to call  ENT tonight. Will refer to ENT for further evaluation.    Labs (all labs ordered are listed, but only abnormal results are displayed) Labs Reviewed - No data to display   Radiology No results found.  Procedures Procedures (including critical care time)  Medications Ordered in ED Medications - No data to display   Initial Impression / Assessment and Plan / ED Course  I have reviewed the triage vital signs and the nursing notes.  Clinical Course    Final Clinical Impressions(s) / ED Diagnoses  65 y.o. female with pain in throat since she swallowed a fish bone over a week ago stable for d/c without difficulty swallowing food or liquids. Discussed with the patient and her family plan of care and they agree with plan. They  will call ENT within 24 hours to schedule an appointment.  Final diagnoses:  Foreign body sensation in throat   I personally performed the services described in this documentation, which was scribed in my presence. The recorded information has been reviewed and is accurate.       DarlingHope M Lucille Crichlow, [place]NP 03/08/16 1535    Loren Raceravid Yelverton, MD 03/19/16 [phone]

## 2016-03-06 NOTE — Patient Instructions (Signed)
     IF you received an x-ray today, you will receive an invoice from [facility]. Please contact [facility] at [phone] with questions or concerns regarding your invoice.   IF you received labwork today, you will receive an invoice from [facility]. Please contact [facility] at [phone] with questions or concerns regarding your invoice.   Our billing staff will not be able to assist you with questions regarding bills from these companies.  You will be contacted with the lab results as soon as they are available. The fastest way to get your results is to activate your My Chart account. Instructions are located on the last page of this paperwork. If you have not heard from us regarding the results in 2 weeks, please contact this office.      

## 2016-03-06 NOTE — ED Triage Notes (Signed)
Pt and family report pt swallowed a fish bone last week. Pt now has pain with swallowing. Pt has tried to alleviate the pain herself without success. Pt denies shortness of breath. Resp e/u.

## 2016-03-07 ENCOUNTER — Encounter (HOSPITAL_COMMUNITY): Payer: Self-pay

## 2016-03-08 ENCOUNTER — Emergency Department (HOSPITAL_COMMUNITY)
Admission: EM | Admit: 2016-03-08 | Discharge: 2016-03-09 | Disposition: A | Discharge status: Home / Self Care | Payer: Self-pay | Attending: Emergency Medicine | Admitting: Emergency Medicine

## 2016-03-08 ENCOUNTER — Emergency Department (HOSPITAL_COMMUNITY): Payer: Self-pay

## 2016-03-08 ENCOUNTER — Encounter (HOSPITAL_COMMUNITY): Payer: Self-pay | Admitting: Emergency Medicine

## 2016-03-08 DIAGNOSIS — E119 Type 2 diabetes mellitus without complications: Secondary | ICD-10-CM | POA: Insufficient documentation

## 2016-03-08 DIAGNOSIS — R0989 Other specified symptoms and signs involving the circulatory and respiratory systems: Secondary | ICD-10-CM

## 2016-03-08 DIAGNOSIS — Y929 Unspecified place or not applicable: Secondary | ICD-10-CM | POA: Insufficient documentation

## 2016-03-08 DIAGNOSIS — X58XXXA Exposure to other specified factors, initial encounter: Secondary | ICD-10-CM | POA: Insufficient documentation

## 2016-03-08 DIAGNOSIS — T17208A Unspecified foreign body in pharynx causing other injury, initial encounter: Secondary | ICD-10-CM | POA: Insufficient documentation

## 2016-03-08 DIAGNOSIS — Z7984 Long term (current) use of oral hypoglycemic drugs: Secondary | ICD-10-CM | POA: Insufficient documentation

## 2016-03-08 DIAGNOSIS — Y999 Unspecified external cause status: Secondary | ICD-10-CM | POA: Insufficient documentation

## 2016-03-08 DIAGNOSIS — Y939 Activity, unspecified: Secondary | ICD-10-CM | POA: Insufficient documentation

## 2016-03-08 NOTE — ED Triage Notes (Signed)
Pt daughter reports patient "has a fish bone stuck in her throat" since Sunday. [facility] and told to follow up with ENT but have been unsuccessful due to lack of insurance. Pt reports pain with swallowing. Pt speaking in full sentences and maintaining saliva. O2 95% in triage

## 2016-03-08 NOTE — ED Notes (Signed)
Patient states she feel like she swallowed a fish bone on Sunday. Patient states that it hurts to swallow. Patient has not had any bleeding, nausea, or vomiting.

## 2016-03-08 NOTE — ED Provider Notes (Signed)
[facility]-EMERGENCY DEPT Provider Note   CSN: [id number] Arrival date & time: 03/08/16  1910  By signing my name below, I, Phillis Haggis, attest that this documentation has been prepared under the direction and in the presence of Sharilyn Sites, PA-C. Electronically Signed: Phillis Haggis, ED Scribe. 03/08/16. 12:21 AM.  History   Chief Complaint Chief Complaint  Patient presents with  . Swallowed Foreign Body   The history is provided by the patient. No language interpreter was used.   HPI Comments: Bridget Russo is a 65 y.o. female with a hx of Type II DM who presents to the Emergency Department complaining of a foreign body in the throat occurring Sunday. Pt swallowed a fish bone and says it is now stuck in her throat. She was seen at [facility] ED two days ago for the same and referred to ENT, but was unable to go due to lack of insurance. Daughter also reports that she called [facility] today as well but they could not see her either.  Pt reports associated sore throat and worsening pain with swallowing. She denies drooling, difficulty eating, nausea or vomiting. No chest pain or SOB.  Past Medical History:  Diagnosis Date  . Arthritis   . Diabetes (HCC) 2012  . Type 2 diabetes mellitus without complication (HCC) 01/18/2014    Patient Active Problem List   Diagnosis Date Noted  . Gastroesophageal reflux disease without esophagitis 03/01/2016  . Pap smear for cervical cancer screening 07/28/2015  . Dizziness 04/21/2015  . Right shoulder pain 04/21/2015  . Colon cancer screening 12/23/2014  . Breast cancer screening 12/23/2014  . Type 2 diabetes mellitus without complications (HCC) 12/23/2014  . Primary osteoarthritis of both knees 05/04/2014  . Type 2 diabetes mellitus without complication (HCC) 01/18/2014  . Dyslipidemia 01/18/2014  . Knee pain, bilateral 06/09/2012  . LLQ abdominal pain 06/09/2012  . Preventative health care 06/09/2012    Past Surgical History:  Procedure  Laterality Date  . BREAST BIOPSY  2005   Negative  . COLONOSCOPY  2003   in [place] 1 polyp removed per pt.    OB History    Gravida Para Term Preterm AB Living   4 3 3  0 1     SAB TAB Ectopic Multiple Live Births   1 0 0           Home Medications    Prior to Admission medications   Medication Sig Start Date End Date Taking? Authorizing Provider  acetaminophen-codeine (TYLENOL #3) 300-30 MG tablet Take 1 tablet by mouth every 4 (four) hours as needed. 03/01/16   Quentin Angst, MD  meclizine (ANTIVERT) 25 MG tablet Take 1 tablet (25 mg total) by mouth 3 (three) times daily as needed for dizziness. Patient not taking: Reported on 03/01/2016 04/21/15   Quentin Angst, MD  meloxicam (MOBIC) 15 MG tablet Take 1 tablet (15 mg total) by mouth daily. 03/01/16   Quentin Angst, MD  meloxicam (MOBIC) 15 MG tablet Take 15 mg by mouth daily.    Historical Provider, MD  metFORMIN (GLUCOPHAGE) 500 MG tablet Take 1 tablet (500 mg total) by mouth 2 (two) times daily with a meal. 03/01/16   Quentin Angst, MD  metFORMIN (GLUCOPHAGE) 500 MG tablet Take by mouth 2 (two) times daily with a meal.    Historical Provider, MD  pantoprazole (PROTONIX) 40 MG tablet Take 1 tablet (40 mg total) by mouth daily. 03/01/16   Quentin Angst, MD  pantoprazole (PROTONIX) 40 MG tablet Take 40 mg by mouth daily.    Historical Provider, MD  pravastatin (PRAVACHOL) 20 MG tablet Take 1 tablet (20 mg total) by mouth daily. 03/01/16   Quentin Angstlugbemiga E Jegede, MD  pravastatin (PRAVACHOL) 20 MG tablet Take 20 mg by mouth daily.    Historical Provider, MD    Family History Family History  Problem Relation Age of Onset  . Diabetes Sister   . Breast cancer Sister   . Diabetes Mother   . Diabetes Brother   . Breast cancer Maternal Grandmother   . Colon cancer Neg Hx   . Hypertension Mother   . Cancer Sister   . Diabetes Brother   . Heart disease Brother   . Diabetes Sister   . Hypercholesterolemia  Sister     Social History Social History  Substance Use Topics  . Smoking status: Never Smoker  . Smokeless tobacco: Never Used  . Alcohol use No     Allergies   Review of patient's allergies indicates no known allergies.   Review of Systems Review of Systems  HENT: Positive for sore throat.   Gastrointestinal: Negative for nausea and vomiting.  All other systems reviewed and are negative.    Physical Exam Updated Vital Signs BP 168/92 (BP Location: Right Arm)   Pulse 68   Temp 98.8 F (37.1 C) (Oral)   Resp 18   Ht 5\' 1"  (1.549 m)   Wt 214 lb (97.1 kg)   SpO2 100%   BMI 40.43 kg/m   Physical Exam  Constitutional: She is oriented to person, place, and time. She appears well-developed and well-nourished.  HENT:  Head: Normocephalic and atraumatic.  Mouth/Throat: Oropharynx is clear and moist.  Oropharynx is clear, no edema, handling secretions well, normal phonation without stridor; trachea midline; no palpable deformities  Eyes: Conjunctivae and EOM are normal. Pupils are equal, round, and reactive to light.  Neck: Normal range of motion.  Cardiovascular: Normal rate, regular rhythm and normal heart sounds.   Pulmonary/Chest: Effort normal and breath sounds normal.  Respirations clear and unlabored  Abdominal: Soft. Bowel sounds are normal.  Musculoskeletal: Normal range of motion.  Neurological: She is alert and oriented to person, place, and time.  Skin: Skin is warm and dry.  Psychiatric: She has a normal mood and affect.  Nursing note and vitals reviewed.    ED Treatments / Results  DIAGNOSTIC STUDIES: Oxygen Saturation is 100% on RA, normal by my interpretation.    COORDINATION OF CARE: 12:20 AM-Discussed treatment plan which includes x-ray with pt at bedside and pt agreed to plan.    Labs (all labs ordered are listed, but only abnormal results are displayed) Labs Reviewed - No data to display  EKG  EKG Interpretation None        Radiology Dg Neck Soft Tissue  Result Date: 03/09/2016 CLINICAL DATA:  Recently had fish bone stuck in throat, with persistent sore throat. EXAM: NECK SOFT TISSUES - 1+ VIEW COMPARISON:  None. FINDINGS: No radiopaque foreign bodies are seen. The nasopharynx, oropharynx and hypopharynx are unremarkable. The proximal trachea is within normal limits. Prevertebral soft tissues are within normal limits. The epiglottis is normal in thickness. Mild degenerative change is noted at the mid cervical spine. The visualized paranasal sinuses and mastoid air cells are well-aerated. The visualized lung apices are clear. IMPRESSION: No radiopaque foreign body seen. Unremarkable radiographs of the soft tissues of the neck. Note that many varieties of fish bones are not  radiopaque on radiograph. Electronically Signed   By: Roanna Raider M.D.   On: 03/09/2016 00:01   Procedures Procedures (including critical care time)  Medications Ordered in ED Medications - No data to display   Initial Impression / Assessment and Plan / ED Course  I have reviewed the triage vital signs and the nursing notes.  Pertinent labs & imaging results that were available during my care of the patient were reviewed by me and considered in my medical decision making (see chart for details).  Clinical Course   65 year old female here with continued sore throat informed body sensation after swallowing a fish bone several days ago. She was seen in the most common emergency department for the same last night referred to ENT, however unable to follow-up due to lack of insurance. Daughter also tried to call GI without success. There has not been any change in symptoms, patient just with continued discomfort and FB sensation. Here she is handling her secretions well. Normal phonation without stridor. Trachea remains midline. Respirations unlabored. Able to eat/drink normally.  Vital signs stable on room air. Films of neck without any evidence  of radiopaque foreign body. Patient was treated here with dose of viscous lidocaine, reports this has eased her discomfort. Her airway remains clear. Discussed with patient and her daughter that given symptoms have been ongoing for several days without change and she continues tolerating oral food and fluids, no emergent consultation warranted at this time.  Have recommended that they continue trying to follow-up with GI or ENT-- may need to go through PCP to do this.  Discussed plan with patient and daughter, they acknowledged understanding and agreed with plan of care.  Return precautions given for new or worsening symptoms.  Final Clinical Impressions(s) / ED Diagnoses   Final diagnoses:  Foreign body sensation in throat    New Prescriptions New Prescriptions   LIDOCAINE (XYLOCAINE) 2 % SOLUTION    Use as directed 20 mLs in the mouth or throat as needed for mouth pain.   I personally performed the services described in this documentation, which was scribed in my presence. The recorded information has been reviewed and is accurate.   Garlon Hatchet, PA-C 03/09/16 0109    Paula Libra, MD 03/09/16 [phone]

## 2016-03-09 MED ORDER — LIDOCAINE VISCOUS 2 % MT SOLN
15.0000 mL | Freq: Once | OROMUCOSAL | Status: AC
  Administered 2016-03-09: 15 mL via OROMUCOSAL
  Filled 2016-03-09: qty 15

## 2016-03-09 MED ORDER — LIDOCAINE VISCOUS 2 % MT SOLN: 20.0000 mL | OROMUCOSAL | 0 refills | Status: AC | PRN

## 2016-03-09 NOTE — Discharge Instructions (Signed)
Take the prescribed medication as directed to help with throat pain. Follow-up with ENT or GI-- you may need to go through your PCP to do this. Return to the ED for new or worsening symptoms.

## 2016-03-28 ENCOUNTER — Ambulatory Visit: Payer: Self-pay

## 2016-04-05 ENCOUNTER — Other Ambulatory Visit: Payer: Self-pay | Admitting: Obstetrics and Gynecology

## 2016-04-05 DIAGNOSIS — Z1231 Encounter for screening mammogram for malignant neoplasm of breast: Secondary | ICD-10-CM

## 2016-04-16 MED FILL — PRAVASTATIN NA 20 MG TAB: 20 | 30 days supply | Qty: 30 | Fill #1

## 2016-04-16 MED FILL — metFORMIN HCL 500 MG TABS: 500 | 30 days supply | Qty: 60 | Fill #1

## 2016-04-16 MED FILL — ?PANTOPRAZOLE SOD DR 40MG: 40 MG | 30 days supply | Qty: 30 | Fill #1

## 2016-04-16 MED FILL — MELOXICAM 15 MG TABLET: 15 | 30 days supply | Qty: 30 | Fill #1

## 2016-05-10 ENCOUNTER — Ambulatory Visit (HOSPITAL_COMMUNITY)
Admission: RE | Admit: 2016-05-10 | Discharge: 2016-05-10 | Disposition: A | Discharge status: Home / Self Care | Payer: Self-pay | Source: Ambulatory Visit | Attending: Obstetrics and Gynecology | Admitting: Obstetrics and Gynecology

## 2016-05-10 ENCOUNTER — Ambulatory Visit
Admission: RE | Admit: 2016-05-10 | Discharge: 2016-05-10 | Disposition: A | Discharge status: Home / Self Care | Payer: No Typology Code available for payment source | Source: Ambulatory Visit | Attending: Obstetrics and Gynecology | Admitting: Obstetrics and Gynecology

## 2016-05-10 ENCOUNTER — Ambulatory Visit: Admission: RE | Admit: 2016-05-10 | Payer: No Typology Code available for payment source | Source: Ambulatory Visit

## 2016-05-10 ENCOUNTER — Other Ambulatory Visit: Payer: Self-pay | Admitting: Obstetrics and Gynecology

## 2016-05-10 ENCOUNTER — Encounter (HOSPITAL_COMMUNITY): Payer: Self-pay

## 2016-05-10 VITALS — BP 158/96 | Temp 98.2°F | Ht 60.0 in | Wt 213.8 lb

## 2016-05-10 DIAGNOSIS — N644 Mastodynia: Secondary | ICD-10-CM

## 2016-05-10 DIAGNOSIS — Z1239 Encounter for other screening for malignant neoplasm of breast: Secondary | ICD-10-CM

## 2016-05-10 DIAGNOSIS — Z1231 Encounter for screening mammogram for malignant neoplasm of breast: Secondary | ICD-10-CM

## 2016-05-10 NOTE — Patient Instructions (Addendum)
Explained breast self awareness to USG CorporationDelsy Riggio. Patient did not need a Pap smear today due to last Pap smear and HPV typing was 07/28/2015. 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 Told patient she can have completed in [facility]CP and to call Sabrina to schedule an appointment. [facility] and possible right breast ultrasound. Appointment scheduled for Thursday, May 10, 2016 at 0940. Maleah Amedee verbalized understanding.  [place], Kathaleen Maserhristine Poll, RN 11:05 AM

## 2016-05-10 NOTE — Progress Notes (Signed)
Complaints of right upper outer breast pain x 2 months that only hurts when touched. Patient rates pain at a 5 out of 10.  Pap Smear:  Pap smear not completed today. [facility] that was normal with positive HPV. Per patient unsure if she has had an abnormal Pap smear. Patient stated there has been two times in the past that she needed a repeat Pap smear but doesn't know if it was because they were abnormal. Last Pap smear result is in EPIC.  Physical exam: Breasts Left breast slightly larger than right breast that per patient is normal for her. No skin abnormalities bilateral breasts. No nipple retraction bilateral breasts. No nipple discharge bilateral breasts. No lymphadenopathy. No lumps palpated bilateral breasts. Complaints of right upper outer breast tenderness on exam. [facility] and possible right breast ultrasound. Appointment scheduled for Thursday, May 10, 2016 at 0940.        Pelvic/Bimanual No Pap smear completed today since last Pap smear and HPV typing was 07/28/2015. Pap smear not indicated per BCCCP guidelines.   Smoking History: Patient has never smoked.  Patient Navigation: Patient education provided. [facility]. Spanish interpreter provided.  Colorectal Cancer Screening: Per patient had a colonoscopy completed in April 2017. No complaints today.  Used Spanish interpreter [employer] Mor[employer] from [employer].

## 2016-05-11 ENCOUNTER — Encounter (HOSPITAL_COMMUNITY): Payer: Self-pay | Admitting: *Deleted

## 2016-06-14 MED FILL — metFORMIN HCL 500 MG TABS: 500 | 30 days supply | Qty: 60 | Fill #2

## 2016-06-14 MED FILL — ?PANTOPRAZOLE SOD DR 40MG: 40 MG | 30 days supply | Qty: 30 | Fill #2

## 2016-06-14 MED FILL — MELOXICAM 15 MG TABLET: 15 | 30 days supply | Qty: 30 | Fill #2

## 2016-06-14 MED FILL — PRAVASTATIN NA 20 MG TAB: 20 | 30 days supply | Qty: 30 | Fill #2

## 2016-07-30 MED FILL — ?PANTOPRAZOLE SOD DR 40MG: 40 MG | 30 days supply | Qty: 30 | Fill #3

## 2016-07-30 MED FILL — ?METFORMIN HCL 500MG TABLET: 500 | 30 days supply | Qty: 60 | Fill #3

## 2016-07-30 MED FILL — ?MELOXICAM 15 MG TABLET: 15 MG | 30 days supply | Qty: 30 | Fill #3

## 2016-07-30 MED FILL — PRAVASTATIN NA 20 MG TAB: 20 | 30 days supply | Qty: 30 | Fill #3

## 2016-08-02 ENCOUNTER — Ambulatory Visit (HOSPITAL_COMMUNITY): Payer: Self-pay

## 2016-10-17 ENCOUNTER — Other Ambulatory Visit: Payer: Self-pay | Admitting: Internal Medicine

## 2016-10-17 DIAGNOSIS — E119 Type 2 diabetes mellitus without complications: Secondary | ICD-10-CM

## 2016-10-17 MED FILL — PANTOPRAZOLE SOD DR 40 MG T: 40 | 30 days supply | Qty: 30 | Fill #4

## 2016-10-17 MED FILL — PRAVASTATIN NA 20 MG TAB: 20 | 30 days supply | Qty: 30 | Fill #4

## 2016-10-17 MED FILL — ?METFORMIN HCL 500MG TABLET: 500 | 30 days supply | Qty: 60 | Fill #4

## 2017-01-03 ENCOUNTER — Other Ambulatory Visit: Payer: Self-pay | Admitting: Internal Medicine

## 2017-01-03 DIAGNOSIS — E119 Type 2 diabetes mellitus without complications: Secondary | ICD-10-CM

## 2017-01-22 ENCOUNTER — Other Ambulatory Visit: Payer: Self-pay | Admitting: Internal Medicine

## 2017-01-22 DIAGNOSIS — E119 Type 2 diabetes mellitus without complications: Secondary | ICD-10-CM

## 2017-01-22 MED FILL — PRAVASTATIN NA 20 MG TAB: 20 | 30 days supply | Qty: 30 | Fill #5

## 2017-01-22 MED FILL — ?PANTOPRAZOLE SOD DR 40MG: 40 MG | 30 days supply | Qty: 30 | Fill #5

## 2017-01-22 MED FILL — ?METFORMIN HCL 500MG TABLET: 500 | 30 days supply | Qty: 60 | Fill #5

## 2017-06-19 ENCOUNTER — Encounter (HOSPITAL_COMMUNITY): Payer: Self-pay

## 2021-04-15 ENCOUNTER — Other Ambulatory Visit: Payer: Self-pay | Admitting: Family Medicine

## 2021-04-15 ENCOUNTER — Ambulatory Visit
Admission: RE | Admit: 2021-04-15 | Discharge: 2021-04-15 | Disposition: A | Discharge status: Home / Self Care | Payer: Medicare Other | Source: Ambulatory Visit | Attending: Family Medicine | Admitting: Family Medicine

## 2021-04-15 ENCOUNTER — Other Ambulatory Visit: Payer: Self-pay

## 2021-04-15 DIAGNOSIS — Z1231 Encounter for screening mammogram for malignant neoplasm of breast: Secondary | ICD-10-CM

## 2021-11-29 IMAGING — MG MM DIGITAL SCREENING BILAT W/ TOMO AND CAD
6 of 10 series · 6 of 30 positions shown · non-contrast
Comparison: Previous exam(s).

CLINICAL DATA: Screening.

EXAM:
DIGITAL SCREENING BILATERAL MAMMOGRAM WITH TOMOSYNTHESIS AND CAD
TECHNIQUE: Bilateral screening digital craniocaudal and mediolateral oblique
mammograms were obtained. Bilateral screening digital breast
tomosynthesis was performed. The images were evaluated with
computer-aided detection.

[L MLO synth-2D]
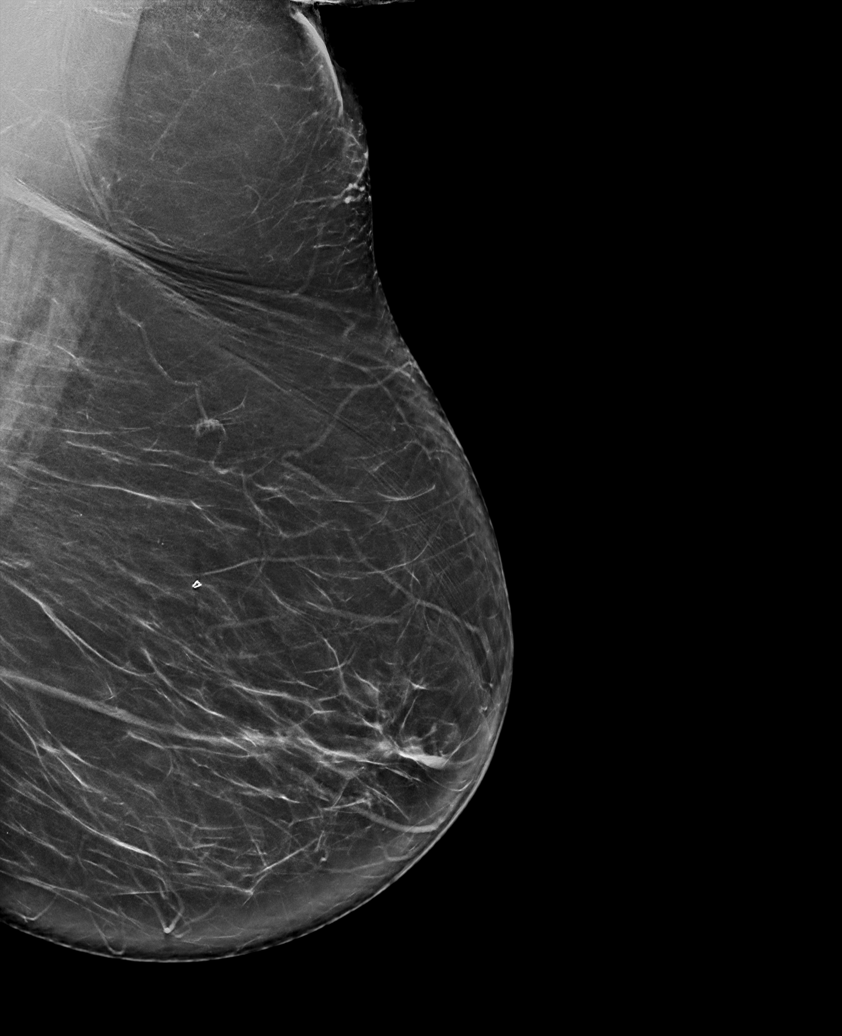

[R MLO synth-2D (1 of 2)]
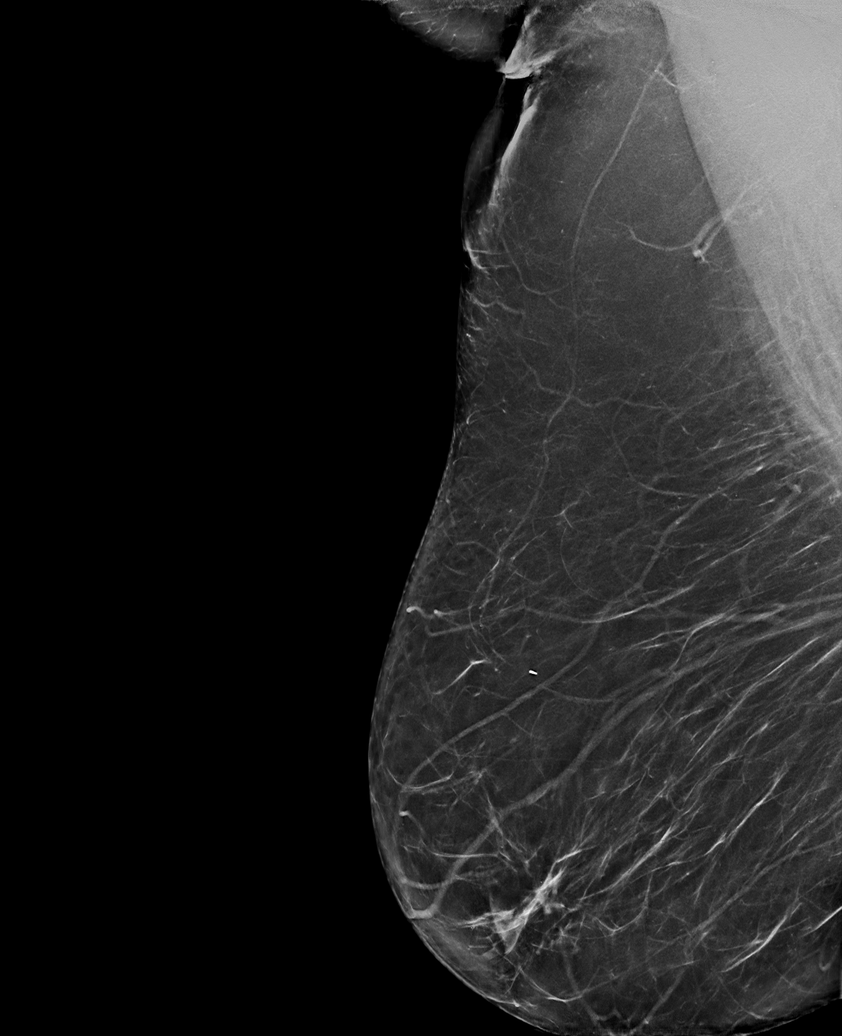

[R MLO synth-2D (2 of 2)]
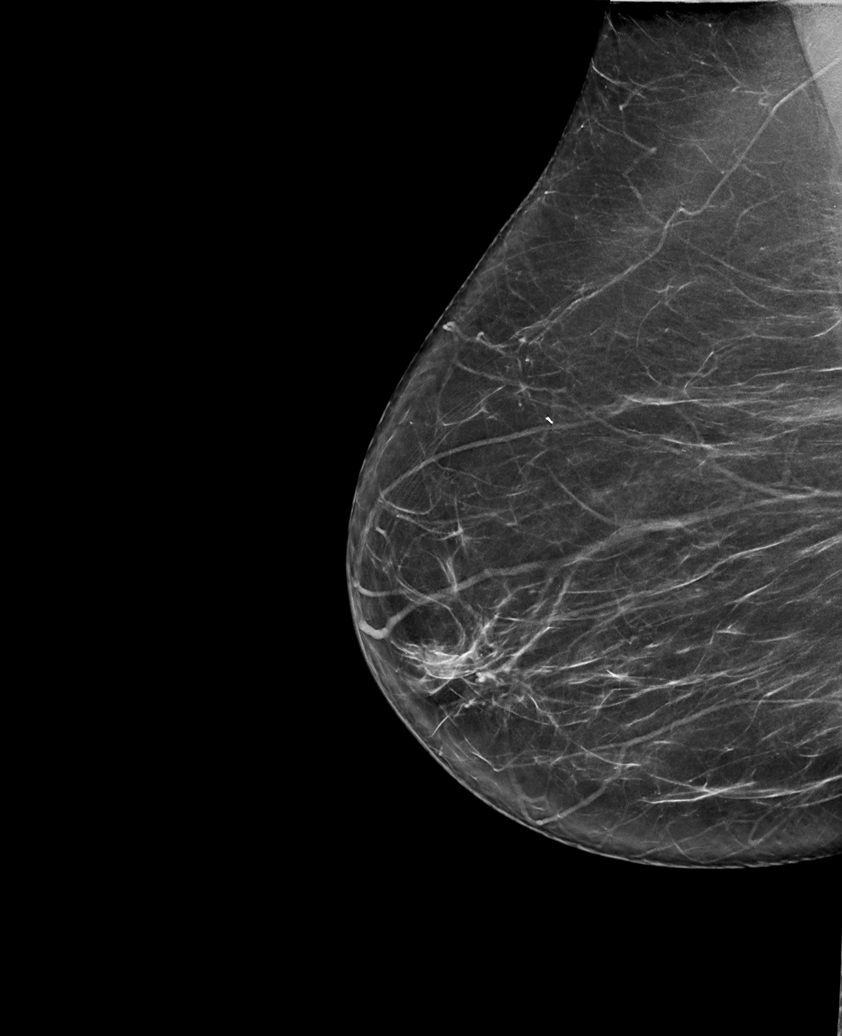

[L CC synth-2D]
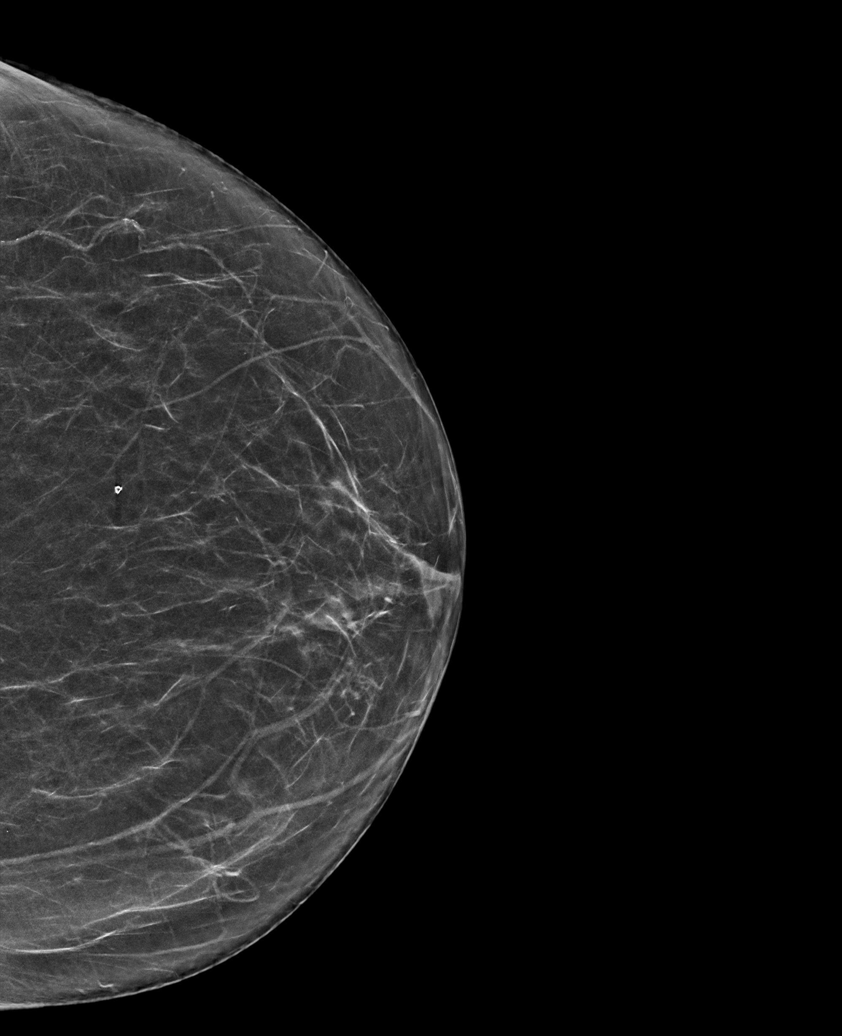

[R CC synth-2D]
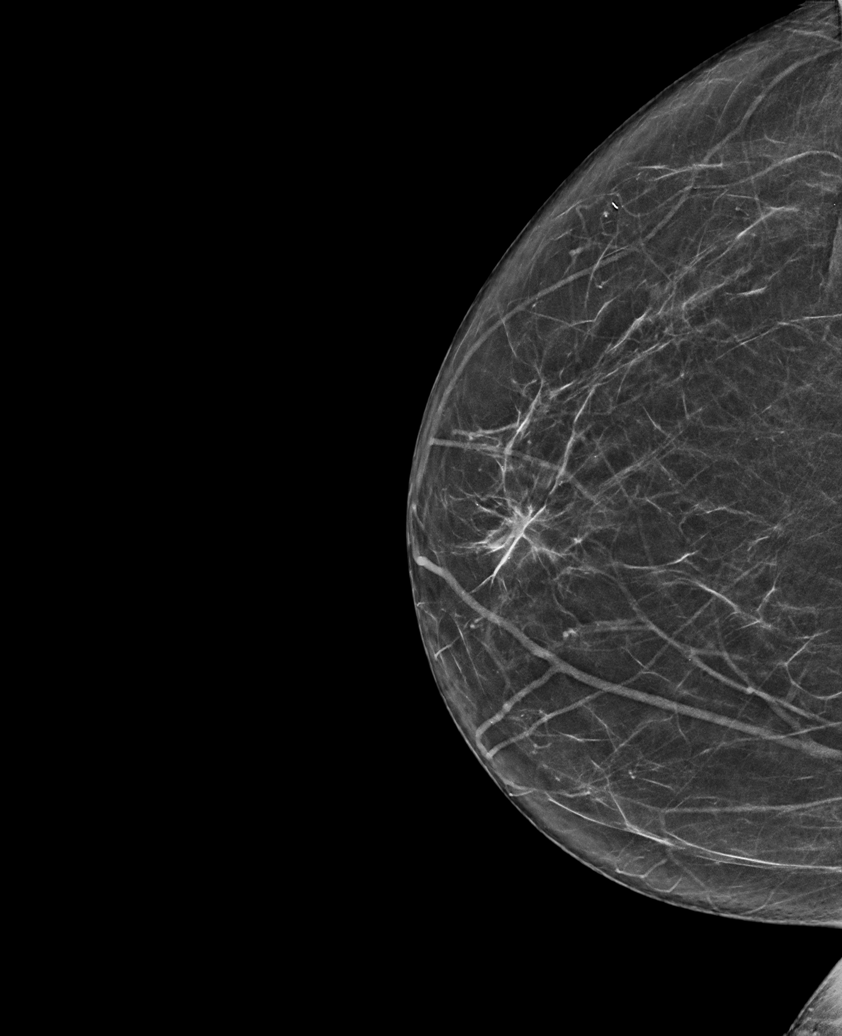

[L CC tomo · tomo slice 33/66.0]
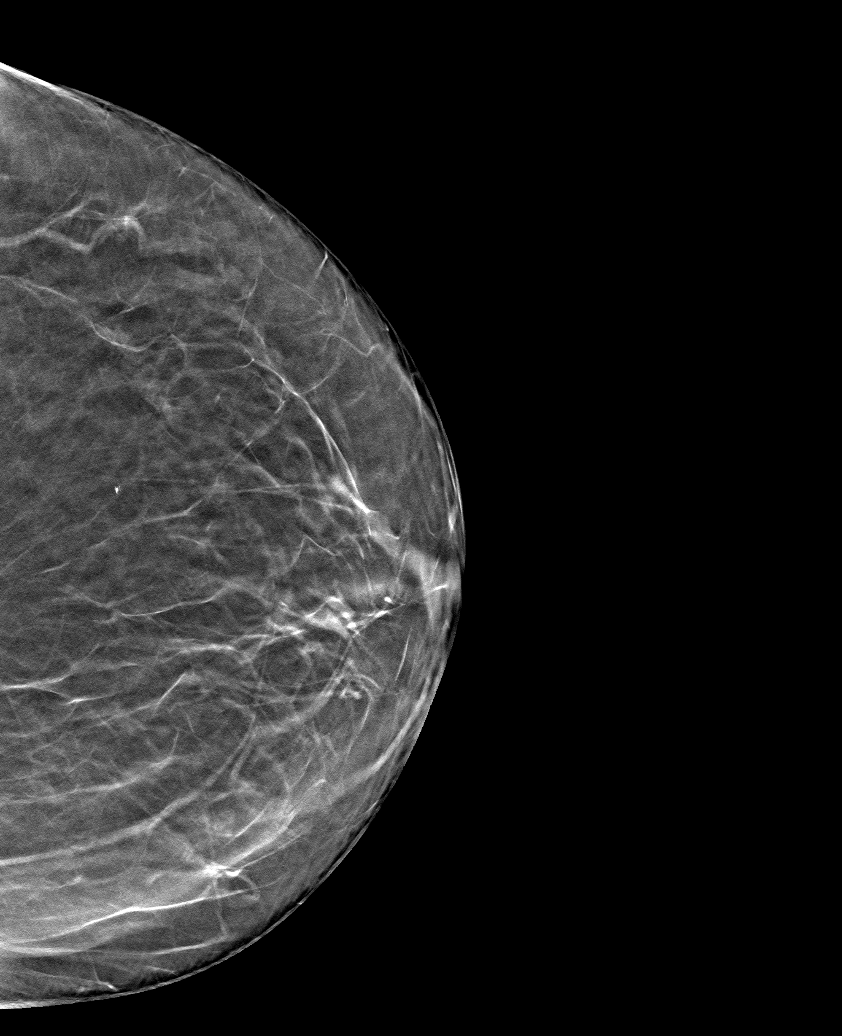

[6 of 30 positions shown; findings below may reference images not displayed]

ACR Breast Density Category b: There are scattered areas of
fibroglandular density.
FINDINGS: There are no findings suspicious for malignancy.
IMPRESSION: No mammographic evidence of malignancy. A result letter of this
screening mammogram will be mailed directly to the patient.

RECOMMENDATION:
Screening mammogram in one year. (Code:51-O-LD2)

BI-RADS CATEGORY  1: Negative.

## 2022-04-11 ENCOUNTER — Encounter: Payer: Self-pay | Admitting: Internal Medicine

## 2023-05-14 ENCOUNTER — Ambulatory Visit: Payer: Medicare Other | Admitting: Family Medicine

## 2023-08-15 NOTE — Progress Notes (Signed)
 [address] - AMBULATORY [facility]  - [facility]NAL MEDICINE [facility] [phone] [place] [address]   August 15, 2023  Patient ID: Bridget Russo is a 73 y.o. female   Chief Complaint  Patient presents with  . Diabetes  . Hyperlipidemia  . Hypertension  . Joint Pain     HPI: Bridget Russo is here for follow up of DM, HLD and HTN.  Regarding DM  Patient takes:  Metformin  1000mg  bid as directed, no side effects. Fasting glucose range not checking Compliant with low carbs diet: yes, exercise: yes Denies polyuria, polydipsia and polyphagia. denies hypoglycemia Last eye exam: 09/25/22 Last foot exam: 04/04/23 Last A1C: 6.7 on 04/04/23  Regarding HTN  Patient is taking blood pressure medications as prescribed. Currently on Olmesartan 20mg  daily  No side effects noted. Patient is exercising daily, does follow low salt diet.  Patient is not checking blood pressure daily. Patient denies symptoms of high blood pressure, no chest pain, sob, nausea, vomiting, dizziness, cough, abdominal pain, headache.  Regarding hyperlipidemia. Patient taking Crestor 10mg  daily as prescribed. No SE noted of medication.  Patient denies myalgias and joint pain. No dizziness or syncope. The 10-year ASCVD risk score (Arnett DK, et al., 2019) is: 17.3%  Patient complaining of multiple joint pains, knee pain, which she has left total knee replacement in the past, advised her to follow-up with her orthopedic surgery.  She also have pain on her right knee, lower back, wrist and shoulders.  She has been told that she has osteoarthritis in the past.  Take Tylenol  intermittently.   ROS    A complete ROS was performed with pertinent positives/negatives noted in the HPI. The remainder of the ROS are negative.    Assessment/Plan:    Peg was seen today for diabetes, hyperlipidemia, hypertension and joint pain.  Diagnoses and all orders for this  visit:  Type 2 diabetes mellitus without complication, without long-term current use of insulin (CMD) Chronic, well controlled DM. Target A1C < 7.0%. Continue current medications. Advised on low carb/fat diet, exercises and weight loss. Importance of glucose monitoring discussed. Will check A1C and renal function.   Orders: -     Comprehensive Metabolic Panel; Future -     Hemoglobin A1C With Estimated Average Glucose; Future  Essential (primary) hypertension Chronic, blood pressure at goal for risk and comorbid conditions. Target BP discussed to be < 130/80.  Continue current medications.  Recommended low-salt diet, exercise and weight loss. BP monitoring at home and keeping a BP log discussed.   Orders: -     Comprehensive Metabolic Panel; Future -     olmesartan (BENICAR) 20 mg tablet; Take 1 tablet (20 mg total) by mouth daily.  Mixed hyperlipidemia Chronic, stable. LDL target < 70. Will continue current regimen. Check lipid pane on next appointment..   Bilateral primary osteoarthritis of knee Bilateral carpal tunnel syndrome Advised NSAIDs twice a day.  For her carpal tunnel I advised her to wear a wrist brace at night.  For her knee pains I advised her to follow-up with her orthopedic surgeon as she might need surgery of the right knee and evaluation of the already left replaced knee.  Orders: -     celecoxib (CeleBREX) 100 mg capsule; Take 1 capsule (100 mg total) by mouth 2 (two) times a day.  Severe obesity (BMI 35.0-39.9) with comorbidity (CMS/HCC) Patient counseled on BMI and unhealthy weight as well as associated  medical conditions. Patient advised on healthy eating and weight loss strategies.  Gastroesophageal reflux disease without esophagitis Chronic, well-controlled on PPI.  Continue current management.  Orders: -     pantoprazole  (PROTONIX ) 20 mg EC tablet; Take 1 tablet (20 mg total) by mouth every morning before breakfast.   Patient verbalizes understanding and  in agreement with the above plan. All questions answered.  Medication side effects discussed with patient. Advised patient to call clinic or return for visit if symptoms occur. Goals of care discussed with patient including med compliance and adequate follow up.  Return in about 4 months (around 12/15/2023) for Chronic conditions .   Physical Exam:  Vital Signs Vitals:   08/15/23 1351  BP: 118/80  BP Location: Right arm  Patient Position: Sitting  Pulse: 97  Resp: 18  SpO2: 96%  Weight: 89.1 kg (196 lb 6.4 oz)  Height: 1.549 m (5' 1)    Constitutional: Obese.  Sitting comfortably conversing normally. Pleasant.  Cardiovascular: +S1S2, regular rate and rhythm, Respiratory: Normal effort, clear to auscultation bilaterally. No wheezes, rales or rhonchi noted.  Extremities: No cyanosis, clubbing or edema. Pulses 2+ all 4 extremities. Musculoskeletal: Bilateral wrist Tinel and Phalen positive.  Right knee with crepitus and tenderness at the medial patella.  Left knee feels loose. Neuro: Alert and oriented x 3. Normal gait.   Skin: Skin is warm and dry. No rash noted.   Medical History    Health Maintenance Status       Date Due Completion Dates   ZOSTER VACCINE (1 of 2) Never done ---   Adult RSV (60+ Years or Pregnancy) (1 - Risk 60-74 years 1-dose series) Never done ---   COVID-19 Vaccine (2 - 2024-25 season) 02/10/2023 01/26/2020   Diabetes: Retinopathy Screening Combo 09/25/2023 09/25/2022, 09/25/2022   DTaP/Tdap/Td Vaccines (1 - Tdap) 04/03/2024 (Originally 05/10/1970) ---   Diabetes:  eGFR for Kidney Evaluation 04/03/2024 04/04/2023, 12/27/2021   Diabetes:  Quantitative uACR for Kidney Evaluation 04/03/2024 04/04/2023, 04/04/2023   Comprehensive Annual Visit 04/03/2024 04/04/2023, 12/27/2021   Medicare Annual Wellness (AWV) Subsequent Visits 04/03/2024 04/04/2023, 04/04/2023   Diabetes: Foot Exam 04/03/2024 04/04/2023, 06/28/2021 (Done)   Comment on 06/28/2021: See [facility]    Diabetes: Hemoglobin A1C 04/03/2024 04/04/2023, 04/04/2023   Breast Cancer Screening (Mammogram) 04/30/2024 05/01/2023, 01/08/2022   Depression Screening 08/14/2024 08/15/2023   Colorectal Cancer Screening 09/18/2025 09/19/2022, 09/19/2022        Allergies No Known Allergies  Medications Current Outpatient Medications  Medication Sig Dispense Refill  . diclofenac sodium (VOLTAREN) 1 % gel  50 g 1  . metFORMIN  (GLUCOPHAGE -XR) 500 mg 24 hr tablet Take 2 tablets (1,000 mg total) by mouth 2 (two) times a day. 360 tablet 1  . rosuvastatin (CRESTOR) 10 mg tablet Take 1 tablet (10 mg total) by mouth daily. 90 tablet 1  . celecoxib (CeleBREX) 100 mg capsule Take 1 capsule (100 mg total) by mouth 2 (two) times a day. 60 capsule 0  . olmesartan (BENICAR) 20 mg tablet Take 1 tablet (20 mg total) by mouth daily. 90 tablet 1  . pantoprazole  (PROTONIX ) 20 mg EC tablet Take 1 tablet (20 mg total) by mouth every morning before breakfast. 90 tablet 1  . tiZANidine (ZANAFLEX) 4 mg tablet Take 4 mg by mouth 3 (three) times a day as needed. Indications: muscle spasm (Patient not taking: Reported on 08/15/2023) 30 tablet 0   No current facility-administered medications for this visit.    Medical history Past Medical  History:  Diagnosis Date  . Diabetes mellitus type II, controlled (CMD)   . Gastroesophageal reflux disease without esophagitis 03/01/2016  . Glaucoma suspect of both eyes 11/24/2018  . Hyperlipidemia   . Hypertension   . OSA (obstructive sleep apnea) 2017   Care Everywhere  . Primary osteoarthritis of both knees 05/04/2014    Surgical History Past Surgical History:  Procedure Laterality Date  . BREAST BIOPSY Right    Procedure: BREAST BIOPSY; 2004 beneign  . KNEE JOINT MANIPULATION Left 12/28/2021   Procedure: CLOSED MANIPULATION OF KNEE UNDER ANESTHESIA;  Surgeon: [place] Miller Case, MD;  Location: [facility];  Service: Orthopedics;  Laterality: Left;  . TOTAL KNEE ARTHROPLASTY  Left 09/12/2021   Procedure: TOTAL KNEE ARTHROPLASTY;  Surgeon: [place] Miller Case, MD;  Location: [facility] OR;  Service: Orthopedics;  Laterality: Left;    Social History Social History   Socioeconomic History  . Marital status: Married    Spouse name: Not on file  . Number of children: Not on file  . Years of education: Not on file  . Highest education level: Not on file  Occupational History  . Not on file  Tobacco Use  . Smoking status: Never  . Smokeless tobacco: Never  Substance and Sexual Activity  . Alcohol use: Never  . Drug use: Never  . Sexual activity: Not on file  Other Topics Concern  . Not on file  Social History Narrative  . Not on file   Social Drivers of Health   Food Insecurity: Low Risk  (11/01/2022)   Food vital sign   . Within the past 12 months, you worried that your food would run out before you got money to buy more: Never true   . Within the past 12 months, the food you bought just didn't last and you didn't have money to get more: Never true  Transportation Needs: No Transportation Needs (11/01/2022)   Transportation   . In the past 12 months, has lack of reliable transportation kept you from medical appointments, meetings, work or from getting things needed for daily living? : No  Safety: Not on file  Living Situation: Medium Risk (11/01/2022)   Living Situation   . What is your living situation today?: I have a place to live today, but I am worried about losing it in the future   . Think about the place you live. Do you have problems with any of the following? Choose all that apply:: None/None on this list    Family History Family History  Problem Relation Name Age of Onset  . Breast cancer Sister    . Stomach cancer Son    . Glaucoma Neg Hx    . Macular degeneration Neg Hx    . Colon cancer Neg Hx      I have reviewed and (if needed) updated the patient's problem list, medications, allergies, past medical and surgical history, social and  family history.   This document serves as a record of services personally performed by Dr. Nikki.  It was created on their behalf by Delon Macario Fairly, LPN, a trained medical scribe, and Licensed Practical Nurse (LPN). During the course of documenting the history, physical exam and medical decision making, I was functioning as a Stage manager. The creation of this record is the provider's dictation and/or activities during the visit.  Electronically signed by Delon Macario Fairly, LPN 11/11/7972 8:72 PM  I agree the documentation is accurate and complete.  Aliene Nikki,  MD  Note - This document was created using the aid of voice recognition Dragon dictation software.

## 2024-01-14 ENCOUNTER — Encounter (HOSPITAL_COMMUNITY): Payer: Self-pay

## 2024-01-14 ENCOUNTER — Emergency Department (HOSPITAL_COMMUNITY)
Admission: EM | Admit: 2024-01-14 | Discharge: 2024-01-15 | Disposition: A | Discharge status: Home / Self Care | Attending: Emergency Medicine | Admitting: Emergency Medicine

## 2024-01-14 DIAGNOSIS — M545 Low back pain, unspecified: Secondary | ICD-10-CM | POA: Diagnosis not present

## 2024-01-14 DIAGNOSIS — B9689 Other specified bacterial agents as the cause of diseases classified elsewhere: Secondary | ICD-10-CM | POA: Diagnosis not present

## 2024-01-14 DIAGNOSIS — E119 Type 2 diabetes mellitus without complications: Secondary | ICD-10-CM | POA: Insufficient documentation

## 2024-01-14 DIAGNOSIS — N390 Urinary tract infection, site not specified: Secondary | ICD-10-CM | POA: Diagnosis not present

## 2024-01-14 DIAGNOSIS — Z7984 Long term (current) use of oral hypoglycemic drugs: Secondary | ICD-10-CM | POA: Insufficient documentation

## 2024-01-14 DIAGNOSIS — R1030 Lower abdominal pain, unspecified: Secondary | ICD-10-CM | POA: Diagnosis present

## 2024-01-14 LAB — BASIC METABOLIC PANEL WITH GFR
Anion gap: 10 (ref 5–15)
BUN: 18 mg/dL (ref 8–23)
CO2: 25 mmol/L (ref 22–32)
Calcium: 10.6 mg/dL — ABNORMAL HIGH (ref 8.9–10.3)
Chloride: 103 mmol/L (ref 98–111)
Creatinine, Ser: 0.74 mg/dL (ref 0.44–1.00)
GFR, Estimated: >60 mL/min (ref >60)
Glucose, Bld: 84 mg/dL (ref 70–99)
Potassium: 4.3 mmol/L (ref 3.5–5.1)
Sodium: 138 mmol/L (ref 135–145)

## 2024-01-14 LAB — URINALYSIS, W/ REFLEX TO CULTURE (INFECTION SUSPECTED)
Bilirubin Urine: NEGATIVE
Glucose, UA: NEGATIVE mg/dL
Hgb urine dipstick: NEGATIVE
Ketones, ur: NEGATIVE mg/dL
Nitrite: NEGATIVE
Protein, ur: NEGATIVE mg/dL
Specific Gravity, Urine: 1.01 (ref 1.005–1.030)
WBC, UA: >50 WBC/hpf (ref 0–5)
pH: 6 (ref 5.0–8.0)

## 2024-01-14 LAB — CBC
HCT: 41.2 % (ref 36.0–46.0)
Hemoglobin: 12.9 g/dL (ref 12.0–15.0)
MCH: 27.6 pg (ref 26.0–34.0)
MCHC: 31.3 g/dL (ref 30.0–36.0)
MCV: 88.2 fL (ref 80.0–100.0)
Platelets: 272 K/uL (ref 150–400)
RBC: 4.67 MIL/uL (ref 3.87–5.11)
RDW: 14.5 % (ref 11.5–15.5)
WBC: 8.5 K/uL (ref 4.0–10.5)
nRBC: 0 % (ref 0.0–0.2)

## 2024-01-14 MED ORDER — CEPHALEXIN 500 MG PO CAPS: 500.0000 mg | ORAL_CAPSULE | Freq: Three times a day (TID) | ORAL | 0 refills | Status: AC

## 2024-01-14 MED ORDER — KETOROLAC TROMETHAMINE 60 MG/2ML IM SOLN
30.0000 mg | Freq: Once | INTRAMUSCULAR | Status: AC
  Administered 2024-01-14: 30 mg via INTRAMUSCULAR
  Filled 2024-01-14: qty 2

## 2024-01-14 MED ORDER — CEPHALEXIN 250 MG PO CAPS
500.0000 mg | ORAL_CAPSULE | Freq: Once | ORAL | Status: AC
  Administered 2024-01-14: 500 mg via ORAL
  Filled 2024-01-14: qty 2

## 2024-01-14 MED ORDER — METHOCARBAMOL 500 MG PO TABS: 500.0000 mg | ORAL_TABLET | Freq: Three times a day (TID) | ORAL | 0 refills | Status: AC | PRN

## 2024-01-14 MED ORDER — NAPROXEN 375 MG PO TABS
375.0000 mg | ORAL_TABLET | Freq: Two times a day (BID) | ORAL | 0 refills | Status: AC
Start: 2024-01-14 — End: ?

## 2024-01-14 MED ORDER — METHOCARBAMOL 500 MG PO TABS
750.0000 mg | ORAL_TABLET | Freq: Once | ORAL | Status: AC
  Administered 2024-01-14: 750 mg via ORAL
  Filled 2024-01-14: qty 2

## 2024-01-14 NOTE — ED Triage Notes (Signed)
 Family currently interpreting for patient. PT reports low back pain and bilateral flank pain since last week. Pt denies injury. Reports she was treated for a UTI two weeks ago but denies urinary symptoms at this time. Pt AxOx4

## 2024-01-14 NOTE — ED Provider Triage Note (Signed)
 Emergency Medicine Provider Triage Evaluation Note  Bridget Russo , a 73 y.o. female  was evaluated in triage.  Pt complains of low back pain, not radicular.  Review of Systems  Positive: LB pain Negative: Urinary symptoms.   Physical Exam  BP 132/81 (BP Location: Right Arm)   Pulse 74   Temp 98.3 F (36.8 C)   Resp 16   SpO2 96%  Gen:   Awake, no distress   Resp:  Normal effort  MSK:   Moves extremities without difficulty  Other:    Medical Decision Making  Medically screening exam initiated at 5:49 PM.  Appropriate orders placed.  Bridget Russo was informed that the remainder of the evaluation will be completed by another provider, this initial triage assessment does not replace that evaluation, and the importance of remaining in the ED until their evaluation is complete.     Bridget Warren SAILOR, PA-C 01/14/24 [id number]

## 2024-01-14 NOTE — ED Provider Notes (Signed)
 [place] EMERGENCY DEPARTMENT AT [facility] Provider Note  CSN: [id number] Arrival date & time: 01/14/24 1545  Chief Complaint(s) Back Pain  HPI Bridget Russo is a 73 y.o. female {Add pertinent medical, surgical, social history, OB history to HPI:1}    Back Pain   Past Medical History Past Medical History:  Diagnosis Date   Arthritis    Diabetes (HCC) 2012   Type 2 diabetes mellitus without complication (HCC) 01/18/2014   Patient Active Problem List   Diagnosis Date Noted   Gastroesophageal reflux disease without esophagitis 03/01/2016   Pap smear for cervical cancer screening 07/28/2015   Dizziness 04/21/2015   Right shoulder pain 04/21/2015   Colon cancer screening 12/23/2014   Breast cancer screening 12/23/2014   Type 2 diabetes mellitus without complications (HCC) 12/23/2014   Primary osteoarthritis of both knees 05/04/2014   Type 2 diabetes mellitus without complication (HCC) 01/18/2014   Dyslipidemia 01/18/2014   Knee pain, bilateral 06/09/2012   LLQ abdominal pain 06/09/2012   Preventative health care 06/09/2012   Home Medication(s) Prior to Admission medications   Medication Sig Start Date End Date Taking? Authorizing Provider  acetaminophen -codeine  (TYLENOL  #3) 300-30 MG tablet Take 1 tablet by mouth every 4 (four) hours as needed. 03/01/16  Yes Jegede, Olugbemiga E, MD  metFORMIN  (GLUCOPHAGE ) 500 MG tablet Take by mouth 2 (two) times daily with a meal.   Yes [provider]  lidocaine  (XYLOCAINE ) 2 % solution Use as directed 20 mLs in the mouth or throat as needed for mouth pain. Patient not taking: Reported on 05/10/2016 03/09/16   Jarold Olam HERO, PA-C  meclizine  (ANTIVERT ) 25 MG tablet Take 1 tablet (25 mg total) by mouth 3 (three) times daily as needed for dizziness. Patient not taking: Reported on 05/10/2016 04/21/15   Jegede, Olugbemiga E, MD  meloxicam  (MOBIC ) 15 MG tablet Take 1 tablet (15 mg total) by mouth daily. 03/01/16   Jegede,  Olugbemiga E, MD  meloxicam  (MOBIC ) 15 MG tablet Take 15 mg by mouth daily.    [provider]  metFORMIN  (GLUCOPHAGE ) 500 MG tablet Take 1 tablet (500 mg total) by mouth 2 (two) times daily with a meal. Patient not taking: Reported on 05/10/2016 03/01/16   Jegede, Olugbemiga E, MD  pantoprazole  (PROTONIX ) 40 MG tablet Take 1 tablet (40 mg total) by mouth daily. Patient not taking: Reported on 05/10/2016 03/01/16   Jegede, Olugbemiga E, MD  pantoprazole  (PROTONIX ) 40 MG tablet Take 40 mg by mouth daily.    [provider]  pravastatin  (PRAVACHOL ) 20 MG tablet Take 1 tablet (20 mg total) by mouth daily. Patient not taking: Reported on 05/10/2016 03/01/16   Jegede, Olugbemiga E, MD  pravastatin  (PRAVACHOL ) 20 MG tablet Take 20 mg by mouth daily.    [provider]  Allergies Patient has no known allergies.  Review of Systems Review of Systems  Musculoskeletal:  Positive for back pain.   As noted in HPI  Physical Exam Vital Signs  I have reviewed the triage vital signs BP 108/66 (BP Location: Left Arm)   Pulse 68   Temp 97.6 F (36.4 C)   Resp (!) 22   SpO2 100%  *** Physical Exam  ED Results and Treatments Labs (all labs ordered are listed, but only abnormal results are displayed) Labs Reviewed  BASIC METABOLIC PANEL WITH GFR - Abnormal; Notable for the following components:      Result Value   Calcium 10.6 (*)    All other components within normal limits  URINALYSIS, W/ REFLEX TO CULTURE (INFECTION SUSPECTED) - Abnormal; Notable for the following components:   APPearance HAZY (*)    Leukocytes,Ua LARGE (*)    Bacteria, UA RARE (*)    All other components within normal limits  URINE CULTURE  CBC                                                                                                                         EKG   EKG Interpretation Date/Time:    Ventricular Rate:    PR Interval:    QRS Duration:    QT Interval:    QTC Calculation:   R Axis:      Text Interpretation:         Radiology No results found.  Medications Ordered in ED Medications  ketorolac  (TORADOL ) injection 30 mg (has no administration in time range)  methocarbamol  (ROBAXIN ) tablet 750 mg (has no administration in time range)  cephALEXin  (KEFLEX ) capsule 500 mg (has no administration in time range)   Procedures Procedures  (including critical care time) Medical Decision Making / ED Course   Medical Decision Making Amount and/or Complexity of Data Reviewed Labs: ordered.    ***    Final Clinical Impression(s) / ED Diagnoses Final diagnoses:  Acute bilateral low back pain without sciatica    This chart was dictated using voice recognition software.  Despite best efforts to proofread,  errors can occur which can change the documentation meaning.

## 2024-01-15 ENCOUNTER — Emergency Department (HOSPITAL_COMMUNITY)

## 2024-01-15 LAB — URINE CULTURE

## 2024-01-15 NOTE — ED Notes (Signed)
 Patient transported to X-ray

## 2024-04-29 ENCOUNTER — Telehealth: Payer: Self-pay

## 2024-04-29 NOTE — Telephone Encounter (Signed)
 Disregard  not the correct pt.
